# Patient Record
Sex: Male | Born: 1970 | Hispanic: Yes | Smoking: Never smoker
Health system: Southern US, Community
[De-identification: ages and names within clinical notes are randomized; demographics above are authoritative.]

## PROBLEM LIST (undated history)

## (undated) DIAGNOSIS — E7800 Pure hypercholesterolemia, unspecified: Secondary | ICD-10-CM

---

## 2006-01-28 ENCOUNTER — Emergency Department (HOSPITAL_COMMUNITY): Admission: EM | Admit: 2006-01-28 | Discharge: 2006-01-28 | Payer: Self-pay | Admitting: Emergency Medicine

## 2011-11-20 ENCOUNTER — Encounter (HOSPITAL_COMMUNITY): Payer: Self-pay | Admitting: *Deleted

## 2011-11-20 ENCOUNTER — Emergency Department (HOSPITAL_COMMUNITY)
Admission: EM | Admit: 2011-11-20 | Discharge: 2011-11-20 | Disposition: A | Discharge status: Home / Self Care | Payer: Self-pay | Attending: Emergency Medicine | Admitting: Emergency Medicine

## 2011-11-20 DIAGNOSIS — N342 Other urethritis: Secondary | ICD-10-CM | POA: Insufficient documentation

## 2011-11-20 LAB — URINALYSIS, ROUTINE W REFLEX MICROSCOPIC
Hgb urine dipstick: NEGATIVE
Leukocytes, UA: NEGATIVE
Nitrite: NEGATIVE
Specific Gravity, Urine: <1.005 — ABNORMAL LOW (ref 1.005–1.030)
Urobilinogen, UA: 0.2 mg/dL (ref 0.0–1.0)

## 2011-11-20 MED ORDER — AZITHROMYCIN 250 MG PO TABS
1000.0000 mg | ORAL_TABLET | Freq: Once | ORAL | Status: AC
  Administered 2011-11-20: 1000 mg via ORAL
  Filled 2011-11-20: qty 4

## 2011-11-20 MED ORDER — HYDROCODONE-ACETAMINOPHEN 5-325 MG PO TABS
1.0000 | ORAL_TABLET | ORAL | Status: AC | PRN
Start: 2011-11-20 — End: 2011-11-30

## 2011-11-20 MED ORDER — LIDOCAINE HCL (PF) 1 % IJ SOLN
INTRAMUSCULAR | Status: AC
  Administered 2011-11-20: 5 mL
  Filled 2011-11-20: qty 5

## 2011-11-20 MED ORDER — CEFTRIAXONE SODIUM 250 MG IJ SOLR
250.0000 mg | Freq: Once | INTRAMUSCULAR | Status: AC
  Administered 2011-11-20: 250 mg via INTRAMUSCULAR
  Filled 2011-11-20: qty 250

## 2011-11-20 MED ORDER — PHENAZOPYRIDINE HCL 200 MG PO TABS: 200.0000 mg | ORAL_TABLET | Freq: Three times a day (TID) | ORAL | Status: AC

## 2011-11-20 NOTE — ED Notes (Signed)
Pt presents to er with c/o burning with urination and left groin pain that started two days ago pt denies any other symptoms.

## 2011-11-20 NOTE — Discharge Instructions (Signed)
Urethritis, Adult  Urethritis is an inflammation (soreness) of the urethra (the tube exiting from the bladder). It is often caused by germs that may be spread through sexual contact.  TREATMENT   Urethritis will usually respond to antibiotics. These are medications that kill germs. Take all the medicine given to you. You may feel better in a couple days, but TAKE ALL MEDICINE or the infection may not be completely cured and may become more difficult to treat. Response can generally be expected in 7 to 10 days. You may require additional treatment after more testing.  HOME CARE INSTRUCTIONS   Not have sex until the test results are known and treatment is completed.   Know that you may be asked to notify your sex partner when your final test results are back.   Finish all medications as prescribed.   Prevent sexually transmitted infections including AIDS. Practice safe sex. Use condoms.  SEEK MEDICAL CARE IF:    Your symptoms are not improved in 2 to 3 days.   Your symptoms are getting worse.   Your develop abdominal pain.   You develop joint pain.  SEEK IMMEDIATE MEDICAL CARE IF:    You have a fever.   You develop severe pain in the belly, back or side.   You develop repeated vomiting.  TEST RESULTS  Not all test results are available during your visit. If your test results are not back during the visit, make an appointment with your caregiver to find out the results. Do not assume everything is normal if you have not heard from your caregiver or the medical facility. It is important for you to follow-up on all of your test results.  Document Released: 02/07/2001 Document Revised: 08/03/2011 Document Reviewed: 08/30/2009  Exit[employer] Patient Information 2012 [employer].

## 2011-11-20 NOTE — ED Provider Notes (Signed)
History     CSN: [id number]  Arrival date & time 11/20/11  6063   None     Chief Complaint  Patient presents with  . Urinary Tract Infection  . Groin Pain    (Consider location/radiation/quality/duration/timing/severity/associated sxs/prior treatment) Patient is a 41 y.o. male presenting with urinary tract infection and groin pain. The history is provided by the patient and a friend.  Urinary Tract Infection This is a new problem. The current episode started in the past 7 days. The problem has been unchanged. Associated symptoms include urinary symptoms. Pertinent negatives include no abdominal pain, anorexia, arthralgias, chest pain, chills, coughing, fever, nausea, neck pain or rash. Exacerbated by: urination. He has tried nothing for the symptoms. The treatment provided no relief.  Groin Pain Associated symptoms include urinary symptoms. Pertinent negatives include no abdominal pain, anorexia, arthralgias, chest pain, chills, coughing, fever, nausea, neck pain or rash.    History reviewed. No pertinent past medical history.  History reviewed. No pertinent past surgical history.  History reviewed. No pertinent family history.  History  Substance Use Topics  . Smoking status: Never Smoker   . Smokeless tobacco: Not on file  . Alcohol Use: No      Review of Systems  Constitutional: Negative for fever, chills and activity change.       All ROS Neg except as noted in HPI  HENT: Negative for nosebleeds and neck pain.   Eyes: Negative for photophobia and discharge.  Respiratory: Negative for cough, shortness of breath and wheezing.   Cardiovascular: Negative for chest pain and palpitations.  Gastrointestinal: Negative for nausea, abdominal pain, blood in stool and anorexia.  Genitourinary: Positive for penile pain. Negative for dysuria, frequency and hematuria.  Musculoskeletal: Negative for back pain and arthralgias.  Skin: Negative.  Negative for rash.  Neurological:  Negative for dizziness, seizures and speech difficulty.  Psychiatric/Behavioral: Negative for hallucinations and confusion.    Allergies  Review of patient's allergies indicates no known allergies.  Home Medications  No current outpatient prescriptions on file.  BP 128/76  Pulse 68  Temp 97.3 F (36.3 C)  Resp 18  Ht 5\' 4"  (1.626 m)  Wt 147 lb (66.679 kg)  BMI 25.23 kg/m2  SpO2 100%  Physical Exam  Nursing note and vitals reviewed. Constitutional: He is oriented to person, place, and time. He appears well-developed and well-nourished.  Non-toxic appearance.  HENT:  Head: Normocephalic.  Right Ear: Tympanic membrane and external ear normal.  Left Ear: Tympanic membrane and external ear normal.  Eyes: EOM and lids are normal. Pupils are equal, round, and reactive to light.  Neck: Normal range of motion. Neck supple. Carotid bruit is not present.  Cardiovascular: Normal rate, regular rhythm, normal heart sounds, intact distal pulses and normal pulses.   Pulmonary/Chest: Breath sounds normal. No respiratory distress.  Abdominal: Soft. Bowel sounds are normal. There is no tenderness. There is no guarding.  Genitourinary:       No drainage or discharge. No rash of the penis. No inguinal hernia. No testicular tenderness. Mild increase redness of the urethra  Musculoskeletal: Normal range of motion.  Lymphadenopathy:       Head (right side): No submandibular adenopathy present.       Head (left side): No submandibular adenopathy present.    He has no cervical adenopathy.  Neurological: He is alert and oriented to person, place, and time. He has normal strength. No cranial nerve deficit or sensory deficit.  Skin: Skin is warm and  dry.  Psychiatric: He has a normal mood and affect. His speech is normal.    ED Course  Procedures (including critical care time)  Labs Reviewed  URINALYSIS, ROUTINE W REFLEX MICROSCOPIC - Abnormal; Notable for the following:    Color, Urine STRAW (*)     Specific Gravity, Urine <1.005 (*)    All other components within normal limits  GC/CHLAMYDIA PROBE AMP, GENITAL   No results found.   No diagnosis found.    MDM  I have reviewed nursing notes, vital signs, and all appropriate lab and imaging results for this patient. Pt treated with Rocephin and zithromax. [facility]       Kathie Dike, [place] 11/26/11 2026

## 2011-11-21 LAB — GC/CHLAMYDIA PROBE AMP, GENITAL
Chlamydia, DNA Probe: NEGATIVE
GC Probe Amp, Genital: NEGATIVE

## 2011-11-27 NOTE — ED Provider Notes (Signed)
Medical screening examination/treatment/procedure(s) were performed by non-physician practitioner and as supervising physician I was immediately available for consultation/collaboration.   Laray Anger, DO 11/27/11 [phone]

## 2014-07-16 ENCOUNTER — Emergency Department (HOSPITAL_COMMUNITY): Payer: Self-pay

## 2014-07-16 ENCOUNTER — Emergency Department (HOSPITAL_COMMUNITY)
Admission: EM | Admit: 2014-07-16 | Discharge: 2014-07-16 | Disposition: A | Discharge status: Home / Self Care | Payer: Self-pay | Attending: Emergency Medicine | Admitting: Emergency Medicine

## 2014-07-16 ENCOUNTER — Encounter (HOSPITAL_COMMUNITY): Payer: Self-pay

## 2014-07-16 DIAGNOSIS — R109 Unspecified abdominal pain: Secondary | ICD-10-CM | POA: Insufficient documentation

## 2014-07-16 DIAGNOSIS — Z791 Long term (current) use of non-steroidal anti-inflammatories (NSAID): Secondary | ICD-10-CM | POA: Insufficient documentation

## 2014-07-16 DIAGNOSIS — Z8639 Personal history of other endocrine, nutritional and metabolic disease: Secondary | ICD-10-CM | POA: Insufficient documentation

## 2014-07-16 HISTORY — DX: Pure hypercholesterolemia, unspecified: E78.00

## 2014-07-16 LAB — BASIC METABOLIC PANEL
Anion gap: 11 (ref 5–15)
BUN: 15 mg/dL (ref 6–23)
CALCIUM: 9 mg/dL (ref 8.4–10.5)
CO2: 27 meq/L (ref 19–32)
CREATININE: 0.77 mg/dL (ref 0.50–1.35)
Chloride: 101 mEq/L (ref 96–112)
GFR calc Af Amer: >90 mL/min (ref >90)
GFR calc non Af Amer: >90 mL/min (ref >90)
GLUCOSE: 150 mg/dL — AB (ref 70–99)
Potassium: 3.6 mEq/L — ABNORMAL LOW (ref 3.7–5.3)
Sodium: 139 mEq/L (ref 137–147)

## 2014-07-16 LAB — URINALYSIS, ROUTINE W REFLEX MICROSCOPIC
BILIRUBIN URINE: NEGATIVE
GLUCOSE, UA: NEGATIVE mg/dL
HGB URINE DIPSTICK: NEGATIVE
KETONES UR: NEGATIVE mg/dL
LEUKOCYTES UA: NEGATIVE
Nitrite: NEGATIVE
PH: 6 (ref 5.0–8.0)
Protein, ur: NEGATIVE mg/dL
Specific Gravity, Urine: 1.025 (ref 1.005–1.030)
Urobilinogen, UA: 0.2 mg/dL (ref 0.0–1.0)

## 2014-07-16 LAB — CBC WITH DIFFERENTIAL/PLATELET
Basophils Absolute: 0 10*3/uL (ref 0.0–0.1)
Basophils Relative: 0 % (ref 0–1)
EOS PCT: 3 % (ref 0–5)
Eosinophils Absolute: 0.2 10*3/uL (ref 0.0–0.7)
HEMATOCRIT: 43 % (ref 39.0–52.0)
Hemoglobin: 14.6 g/dL (ref 13.0–17.0)
LYMPHS ABS: 2.1 10*3/uL (ref 0.7–4.0)
LYMPHS PCT: 43 % (ref 12–46)
MCH: 30 pg (ref 26.0–34.0)
MCHC: 34 g/dL (ref 30.0–36.0)
MCV: 88.3 fL (ref 78.0–100.0)
MONO ABS: 0.5 10*3/uL (ref 0.1–1.0)
MONOS PCT: 10 % (ref 3–12)
Neutro Abs: 2.1 10*3/uL (ref 1.7–7.7)
Neutrophils Relative %: 44 % (ref 43–77)
Platelets: 220 10*3/uL (ref 150–400)
RBC: 4.87 MIL/uL (ref 4.22–5.81)
RDW: 13.3 % (ref 11.5–15.5)
WBC: 4.9 10*3/uL (ref 4.0–10.5)

## 2014-07-16 MED ORDER — HYDROCODONE-ACETAMINOPHEN 5-325 MG PO TABS: 1.0000 | ORAL_TABLET | Freq: Four times a day (QID) | ORAL | Status: DC | PRN

## 2014-07-16 MED ORDER — NAPROXEN 500 MG PO TABS: 500.0000 mg | ORAL_TABLET | Freq: Two times a day (BID) | ORAL | Status: DC

## 2014-07-16 MED ORDER — HYDROCODONE-ACETAMINOPHEN 5-325 MG PO TABS
1.0000 | ORAL_TABLET | Freq: Once | ORAL | Status: AC
  Administered 2014-07-16: 1 via ORAL
  Filled 2014-07-16: qty 1

## 2014-07-16 NOTE — ED Notes (Signed)
Left flank pain per pt. X 1 week.

## 2014-07-16 NOTE — ED Notes (Signed)
Pt reports 10/10 left lower rib cage pain. Pt denies change in urinary pattern. Pt reports malodorus urine. Pt denies pain upon urination. Pt reports pain upon sneezing.

## 2014-07-16 NOTE — Discharge Instructions (Signed)
Flank Pain Flank pain is pain in your side. The flank is the area of your side between your upper belly (abdomen) and your back. Pain in this area can be caused by many different things. HOME CARE Home care and treatment will depend on the cause of your pain.  Rest as told by your doctor.  Drink enough fluids to keep your pee (urine) clear or pale yellow.  Only take medicine as told by your doctor.  Tell your doctor about any changes in your pain.  Follow up with your doctor. GET HELP RIGHT AWAY IF:   Your pain does not get better with medicine.   You have new symptoms or your symptoms get worse.  Your pain gets worse.   You have belly (abdominal) pain.   You are short of breath.   You always feel sick to your stomach (nauseous).   You keep throwing up (vomiting).   You have puffiness (swelling) in your belly.   You feel light-headed or you pass out (faint).   You have blood in your pee.  You have a fever or lasting symptoms for more than 2-3 days.  You have a fever and your symptoms suddenly get worse. MAKE SURE YOU:   Understand these instructions.  Will watch your condition.  Will get help right away if you are not doing well or get worse. Document Released: 05/23/2008 Document Revised: 12/29/2013 Document Reviewed: 03/28/2012 [facility] Patient Information 2015 [place]Care, [place]. This information is not intended to replace advice given to you by your health care provider. Make sure you discuss any questions you have with your health care provider.  Workup for the flank pain including a normal urinalysis no signs of infection. CAT scan negative for any problems inside the abdomen. Also no evidence of any kidney stones. Symptoms may be musculoskeletal in nature. Chest x-ray also negative. Take the Naprosyn on a regular basis. Supplement with the hydrocodone as needed for additional pain relief. Work note provided for the next 2 days. Return for any new or  worse symptoms.

## 2014-07-16 NOTE — ED Provider Notes (Signed)
CSN: [id number]     Arrival date & time 07/16/14  1320 History  This chart was scribed for Vanetta MuldersScott Brazos Sandoval, MD by Annye AsaAnna Dorsett, ED Scribe. This patient was seen in room APA14/APA14 and the patient's care was started at 4:46 PM.    Chief Complaint  Patient presents with  . Flank Pain   Patient is a 43 y.o. male presenting with flank pain. The history is provided by the patient. No language interpreter was used.  Flank Pain This is a new problem. The current episode started more than 1 week ago. The problem occurs constantly. The problem has not changed since onset.Pertinent negatives include no chest pain, no abdominal pain, no headaches and no shortness of breath. The symptoms are aggravated by sneezing. Nothing relieves the symptoms. He has tried nothing for the symptoms.     HPI Comments: George Morales is a 43 y.o. male who presents to the Emergency Department complaining of 1 week of constant left flank pain, rated 10/10 at present. Pain is worse with sneezing. Patient denies nausea, vomiting, fevers. He denies prior experience with similar symptoms.   Past Medical History  Diagnosis Date  . Hypercholesteremia    No past surgical history on file. No family history on file. History  Substance Use Topics  . Smoking status: Never Smoker   . Smokeless tobacco: Not on file  . Alcohol Use: No    Review of Systems  Constitutional: Negative for fever and chills.  HENT: Negative for congestion, rhinorrhea and sore throat.   Eyes: Negative for visual disturbance.  Respiratory: Negative for cough and shortness of breath.   Cardiovascular: Negative for chest pain and leg swelling.  Gastrointestinal: Negative for nausea, vomiting, abdominal pain and diarrhea.  Genitourinary: Positive for flank pain. Negative for dysuria, frequency and hematuria.  Musculoskeletal: Negative for back pain.  Skin: Negative for rash.  Neurological: Negative for headaches.  Hematological: Does not bruise/bleed  easily.  Psychiatric/Behavioral: Negative for confusion.      Allergies  Morphine and related  Home Medications   Prior to Admission medications   Medication Sig Start Date End Date Taking? Authorizing Provider  acetaminophen (TYLENOL) 500 MG tablet Take 500 mg by mouth every 6 (six) hours as needed for mild pain or moderate pain.   Yes Historical Provider, MD  Omega-3 Fatty Acids (FISH OIL) 1000 MG CAPS Take 2 capsules by mouth 2 (two) times daily.   Yes Historical Provider, MD  HYDROcodone-acetaminophen (NORCO) 5-325 MG per tablet Take 1-2 tablets by mouth every 6 (six) hours as needed for moderate pain. 07/16/14   Vanetta MuldersScott Delories Mauri, MD  naproxen (NAPROSYN) 500 MG tablet Take 1 tablet (500 mg total) by mouth 2 (two) times daily. 07/16/14   Vanetta MuldersScott Elizah Mierzwa, MD   BP 114/71 mmHg  Pulse 68  Temp(Src) 98.2 F (36.8 C) (Oral)  Resp 17  Ht 5\' 4"  (1.626 m)  Wt 147 lb (66.679 kg)  BMI 25.22 kg/m2  SpO2 100% Physical Exam  Constitutional: He is oriented to person, place, and time. He appears well-developed and well-nourished.  HENT:  Head: Normocephalic and atraumatic.  Neck: No tracheal deviation present.  Cardiovascular: Normal rate, regular rhythm and normal heart sounds.  Exam reveals no gallop and no friction rub.   No murmur heard. Pulmonary/Chest: Effort normal and breath sounds normal. No respiratory distress. He has no wheezes. He has no rales.  Abdominal: Soft. Bowel sounds are normal. He exhibits no distension. There is tenderness (Lower part of the chest; rib  margin). There is no rebound and no guarding.  Musculoskeletal: He exhibits no edema.  Neurological: He is alert and oriented to person, place, and time.  Skin: Skin is warm and dry.  Psychiatric: He has a normal mood and affect. His behavior is normal.  Nursing note and vitals reviewed.   ED Course  Procedures   DIAGNOSTIC STUDIES: Oxygen Saturation is 99% on RA, normal by my interpretation.    COORDINATION OF  CARE: 5:57 PM Discussed treatment plan with pt at bedside and pt agreed to plan.   Labs Review Labs Reviewed  BASIC METABOLIC PANEL - Abnormal; Notable for the following:    Potassium 3.6 (*)    Glucose, Bld 150 (*)    All other components within normal limits  CBC WITH DIFFERENTIAL  URINALYSIS, ROUTINE W REFLEX MICROSCOPIC   Results for orders placed or performed during the hospital encounter of 07/16/14  CBC with Differential  Result Value Ref Range   WBC 4.9 4.0 - 10.5 K/uL   RBC 4.87 4.22 - 5.81 MIL/uL   Hemoglobin 14.6 13.0 - 17.0 g/dL   HCT 40.943.0 81.139.0 - 91.452.0 %   MCV 88.3 78.0 - 100.0 fL   MCH 30.0 26.0 - 34.0 pg   MCHC 34.0 30.0 - 36.0 g/dL   RDW 78.213.3 95.611.5 - 21.315.5 %   Platelets 220 150 - 400 K/uL   Neutrophils Relative % 44 43 - 77 %   Neutro Abs 2.1 1.7 - 7.7 K/uL   Lymphocytes Relative 43 12 - 46 %   Lymphs Abs 2.1 0.7 - 4.0 K/uL   Monocytes Relative 10 3 - 12 %   Monocytes Absolute 0.5 0.1 - 1.0 K/uL   Eosinophils Relative 3 0 - 5 %   Eosinophils Absolute 0.2 0.0 - 0.7 K/uL   Basophils Relative 0 0 - 1 %   Basophils Absolute 0.0 0.0 - 0.1 K/uL  Basic metabolic panel  Result Value Ref Range   Sodium 139 137 - 147 mEq/L   Potassium 3.6 (L) 3.7 - 5.3 mEq/L   Chloride 101 96 - 112 mEq/L   CO2 27 19 - 32 mEq/L   Glucose, Bld 150 (H) 70 - 99 mg/dL   BUN 15 6 - 23 mg/dL   Creatinine, Ser 0.860.77 0.50 - 1.35 mg/dL   Calcium 9.0 8.4 - 57.810.5 mg/dL   GFR calc non Af Amer >90 >90 mL/min   GFR calc Af Amer >90 >90 mL/min   Anion gap 11 5 - 15  Urinalysis, Routine w reflex microscopic  Result Value Ref Range   Color, Urine YELLOW YELLOW   APPearance CLEAR CLEAR   Specific Gravity, Urine 1.025 1.005 - 1.030   pH 6.0 5.0 - 8.0   Glucose, UA NEGATIVE NEGATIVE mg/dL   Hgb urine dipstick NEGATIVE NEGATIVE   Bilirubin Urine NEGATIVE NEGATIVE   Ketones, ur NEGATIVE NEGATIVE mg/dL   Protein, ur NEGATIVE NEGATIVE mg/dL   Urobilinogen, UA 0.2 0.0 - 1.0 mg/dL   Nitrite NEGATIVE  NEGATIVE   Leukocytes, UA NEGATIVE NEGATIVE     Imaging Review Dg Chest 2 View  07/16/2014   CLINICAL DATA:  Left-sided flank pain  EXAM: CHEST  2 VIEW  COMPARISON:  05/11/2012  FINDINGS: The cardiac shadow is stable. The lungs are clear bilaterally. No acute bony abnormality is seen.  IMPRESSION: No active cardiopulmonary disease.   Electronically Signed   By: Alcide CleverMark  Lukens M.D.   On: 07/16/2014 17:48   Ct Renal Stone Study  07/16/2014   CLINICAL DATA:  Acute left flank pain.  EXAM: CT ABDOMEN AND PELVIS WITHOUT CONTRAST  TECHNIQUE: Multidetector CT imaging of the abdomen and pelvis was performed following the standard protocol without IV contrast.  COMPARISON:  05/11/2012  FINDINGS: Lower chest: The lung bases are clear of acute process. No pleural effusion or pulmonary lesions. The heart is normal in size. No pericardial effusion. The distal esophagus and aorta are unremarkable.  Hepatobiliary: No focal liver lesions or intrahepatic biliary dilatation. The gallbladder is normal. No common bile duct dilatation.  Pancreas: Normal  Spleen: Normal  Adrenals/Urinary Tract: The adrenal glands are normal. Both kidneys are normal. No renal or obstructing ureteral calculi. No hydronephrosis. The bladder is mildly distended. No bladder calculi.  Stomach/Bowel: The stomach, duodenum, small bowel and colon are grossly normal without oral contrast. No inflammatory changes, mass lesions or obstructive findings. The terminal ileum is normal. The appendix is normal.  Vascular/Lymphatic: No mesenteric or retroperitoneal mass or adenopathy. The aorta is normal in caliber. No atherosclerotic calcifications.  Pelvis: The bladder is mildly distended. The prostate gland and seminal vesicles are unremarkable. No pelvic mass, adenopathy or free pelvic fluid collections. No inguinal mass or adenopathy.  Musculoskeletal: No significant bony findings.  IMPRESSION: No acute abdominal/ pelvic findings, mass lesions or adenopathy.   No renal, ureteral or bladder calculi.   Electronically Signed   By: Loralie Champagne M.D.   On: 07/16/2014 17:47     EKG Interpretation None      MDM   Final diagnoses:  Flank pain     Workup for the flank pain without any significant findings. Baby musculoskeletal in nature. Some of the discomfort was at the rib margin on the left flank. No history of injury. Will treat with anti-inflammatories and supplemental pain medicine. When patient return for any new or worse symptoms. No evidence urinary tract infection. Chest x-ray negative for pneumonia pneumothorax or any evidence of pleural effusion. CT scan of the abdomen negative for any ureteral stones or other intra-abdominal abnormalities.  I personally performed the services described in this documentation, which was scribed in my presence. The recorded information has been reviewed and is accurate.       Vanetta Mulders, MD 07/16/14 1758

## 2015-02-05 ENCOUNTER — Emergency Department (HOSPITAL_COMMUNITY)
Admission: EM | Admit: 2015-02-05 | Discharge: 2015-02-06 | Disposition: A | Discharge status: Home / Self Care | Payer: Self-pay | Attending: Emergency Medicine | Admitting: Emergency Medicine

## 2015-02-05 ENCOUNTER — Encounter (HOSPITAL_COMMUNITY): Payer: Self-pay

## 2015-02-05 DIAGNOSIS — E780 Pure hypercholesterolemia: Secondary | ICD-10-CM | POA: Insufficient documentation

## 2015-02-05 DIAGNOSIS — Z791 Long term (current) use of non-steroidal anti-inflammatories (NSAID): Secondary | ICD-10-CM | POA: Insufficient documentation

## 2015-02-05 DIAGNOSIS — F10920 Alcohol use, unspecified with intoxication, uncomplicated: Secondary | ICD-10-CM

## 2015-02-05 DIAGNOSIS — Z79899 Other long term (current) drug therapy: Secondary | ICD-10-CM | POA: Insufficient documentation

## 2015-02-05 DIAGNOSIS — F10120 Alcohol abuse with intoxication, uncomplicated: Secondary | ICD-10-CM | POA: Insufficient documentation

## 2015-02-05 NOTE — ED Notes (Signed)
Pt apparently drinking heavily tonight and has had several panic attacks, is hyperventilating and will not stop when asked. Pt c/o tingling in his hands and feet

## 2015-02-06 LAB — CBC WITH DIFFERENTIAL/PLATELET
BASOS ABS: 0 10*3/uL (ref 0.0–0.1)
BASOS PCT: 0 % (ref 0–1)
Eosinophils Absolute: 0 10*3/uL (ref 0.0–0.7)
Eosinophils Relative: 0 % (ref 0–5)
HEMATOCRIT: 42.4 % (ref 39.0–52.0)
HEMOGLOBIN: 14.2 g/dL (ref 13.0–17.0)
LYMPHS ABS: 1.1 10*3/uL (ref 0.7–4.0)
Lymphocytes Relative: 10 % — ABNORMAL LOW (ref 12–46)
MCH: 29.4 pg (ref 26.0–34.0)
MCHC: 33.5 g/dL (ref 30.0–36.0)
MCV: 87.8 fL (ref 78.0–100.0)
MONO ABS: 0.6 10*3/uL (ref 0.1–1.0)
MONOS PCT: 5 % (ref 3–12)
NEUTROS PCT: 85 % — AB (ref 43–77)
Neutro Abs: 8.8 10*3/uL — ABNORMAL HIGH (ref 1.7–7.7)
PLATELETS: 209 10*3/uL (ref 150–400)
RBC: 4.83 MIL/uL (ref 4.22–5.81)
RDW: 13.1 % (ref 11.5–15.5)
WBC: 10.4 10*3/uL (ref 4.0–10.5)

## 2015-02-06 LAB — COMPREHENSIVE METABOLIC PANEL
ALBUMIN: 4.3 g/dL (ref 3.5–5.0)
ALT: 39 U/L (ref 17–63)
ANION GAP: 13 (ref 5–15)
AST: 25 U/L (ref 15–41)
Alkaline Phosphatase: 53 U/L (ref 38–126)
BUN: 17 mg/dL (ref 6–20)
CALCIUM: 8.8 mg/dL — AB (ref 8.9–10.3)
CO2: 22 mmol/L (ref 22–32)
CREATININE: 0.62 mg/dL (ref 0.61–1.24)
Chloride: 105 mmol/L (ref 101–111)
GFR calc Af Amer: >60 mL/min (ref >60)
GFR calc non Af Amer: >60 mL/min (ref >60)
GLUCOSE: 118 mg/dL — AB (ref 65–99)
POTASSIUM: 3.2 mmol/L — AB (ref 3.5–5.1)
SODIUM: 140 mmol/L (ref 135–145)
Total Bilirubin: 0.8 mg/dL (ref 0.3–1.2)
Total Protein: 7.1 g/dL (ref 6.5–8.1)

## 2015-02-06 LAB — ETHANOL: ALCOHOL ETHYL (B): 111 mg/dL — AB (ref <5)

## 2015-02-06 MED ORDER — SODIUM CHLORIDE 0.9 % IV BOLUS (SEPSIS)
1000.0000 mL | Freq: Once | INTRAVENOUS | Status: AC
  Administered 2015-02-06: 1000 mL via INTRAVENOUS

## 2015-02-06 NOTE — ED Provider Notes (Signed)
CSN: [id number]     Arrival date & time 02/05/15  2339 History  This chart was scribed for Geoffery Lyons, MD by Karle Plumber, ED Scribe. This patient was seen in room APA06/APA06 and the patient's care was started at 12:26 AM.  Chief Complaint  Patient presents with  . Alcohol Intoxication   Patient is a 44 y.o. male presenting with intoxication. The history is provided by the patient and medical records. No language interpreter was used.  Alcohol Intoxication   LEVEL 5 CAVEAT- Full history could not be obtained due to intoxication.  HPI Comments:  George Morales is a 44 y.o. male who presents to the Emergency Department complaining of alcohol intoxication. His girlfriend states after work the pt started drinking beer constantly for about 4.5 hours. She reports associated emesis. She reports he was breathing heavily and having cramps in his fingers as well. He has not done anything to treat his symptoms. Denies modifying factors. She denies any illicit drug use. PMHx of hypercholesteremia.  Past Medical History  Diagnosis Date  . Hypercholesteremia    History reviewed. No pertinent past surgical history. No family history on file. History  Substance Use Topics  . Smoking status: Never Smoker   . Smokeless tobacco: Not on file  . Alcohol Use: Yes    LEVEL 5 CAVEAT- Full history could not be obtained due to intoxication.  Review of Systems  Unable to perform ROS   Allergies  Morphine and related  Home Medications   Prior to Admission medications   Medication Sig Start Date End Date Taking? Authorizing Provider  acetaminophen (TYLENOL) 500 MG tablet Take 500 mg by mouth every 6 (six) hours as needed for mild pain or moderate pain.    Historical Provider, MD  HYDROcodone-acetaminophen (NORCO) 5-325 MG per tablet Take 1-2 tablets by mouth every 6 (six) hours as needed for moderate pain. 07/16/14   Vanetta Mulders, MD  naproxen (NAPROSYN) 500 MG tablet Take 1 tablet (500 mg  total) by mouth 2 (two) times daily. 07/16/14   Vanetta Mulders, MD  Omega-3 Fatty Acids (FISH OIL) 1000 MG CAPS Take 2 capsules by mouth 2 (two) times daily.    Historical Provider, MD  simvastatin (ZOCOR) 20 MG tablet Take 20 mg by mouth daily.    Historical Provider, MD   Triage Vitals: BP 115/82 mmHg  Pulse 100  Temp(Src) 97.9 F (36.6 C) (Oral)  Resp 26  SpO2 93% Physical Exam  Constitutional: He is oriented to person, place, and time. He appears well-developed and well-nourished.  Pt is somnolent but arousable and appropriate. Intoxicated.  HENT:  Head: Normocephalic and atraumatic.  Eyes: EOM are normal. Pupils are equal, round, and reactive to light.  Neck: Normal range of motion.  Cardiovascular: Normal rate, regular rhythm and normal heart sounds.  Exam reveals no gallop and no friction rub.   No murmur heard. Pulmonary/Chest: Effort normal and breath sounds normal. No respiratory distress. He has no wheezes. He has no rales.  Musculoskeletal: Normal range of motion.  Neurological: He is alert and oriented to person, place, and time.  Neuro exam difficult due to intoxication. Moves all four extremities and responds to commands appropriately.  Skin: Skin is warm and dry.  Psychiatric: He has a normal mood and affect. His behavior is normal.  Nursing note and vitals reviewed.   ED Course  Procedures (including critical care time) DIAGNOSTIC STUDIES: Oxygen Saturation is 93% on RA, low by my interpretation.   COORDINATION OF CARE:  12:30 AM- Will order fluids and labs. Pt verbalizes understanding and agrees to plan.  Medications - No data to display  Labs Review Labs Reviewed - No data to display  Imaging Review No results found.   EKG Interpretation None      MDM   Final diagnoses:  None    Patient brought for evaluation by EMS of suspected alcohol intoxication. The patient got off work this afternoon and went drinking with his coworkers. This patient  does not drink very often and was apparently coaxed into chugging beers with his friends. He became intoxicated and incoherent and was brought here to be evaluated. His blood alcohol is 111 and he otherwise appears well. He is somnolent, but is easily arousable and follows commands. He was observed for several hours in the ER and is now ambulatory and appears better. He will be discharged with instructions to avoid drinking in excess and return to the ER as needed for any problems.  I personally performed the services described in this documentation, which was scribed in my presence. The recorded information has been reviewed and is accurate.    Geoffery Lyons, MD 02/06/15 [id number]

## 2015-02-06 NOTE — ED Notes (Signed)
Went in to do rounding and patient was asleep. Spoke with family and got them coffee.

## 2015-02-06 NOTE — Discharge Instructions (Signed)
Alcohol Intoxication  Alcohol intoxication occurs when the amount of alcohol that a person has consumed impairs his or her ability to mentally and physically function. Alcohol directly impairs the normal chemical activity of the brain. Drinking large amounts of alcohol can lead to changes in mental function and behavior, and it can cause many physical effects that can be harmful.   Alcohol intoxication can range in severity from mild to very severe. Various factors can affect the level of intoxication that occurs, such as the person's age, gender, weight, frequency of alcohol consumption, and the presence of other medical conditions (such as diabetes, seizures, or heart conditions). Dangerous levels of alcohol intoxication may occur when people drink large amounts of alcohol in a short period (binge drinking). Alcohol can also be especially dangerous when combined with certain prescription medicines or "recreational" drugs.  SIGNS AND SYMPTOMS  Some common signs and symptoms of mild alcohol intoxication include:  · Loss of coordination.  · Changes in mood and behavior.  · Impaired judgment.  · Slurred speech.  As alcohol intoxication progresses to more severe levels, other signs and symptoms will appear. These may include:  · Vomiting.  · Confusion and impaired memory.  · Slowed breathing.  · Seizures.  · Loss of consciousness.  DIAGNOSIS   Your health care provider will take a medical history and perform a physical exam. You will be asked about the amount and type of alcohol you have consumed. Blood tests will be done to measure the concentration of alcohol in your blood. In many places, your blood alcohol level must be lower than 80 mg/dL (0.08%) to legally drive. However, many dangerous effects of alcohol can occur at much lower levels.   TREATMENT   People with alcohol intoxication often do not require treatment. Most of the effects of alcohol intoxication are temporary, and they go away as the alcohol naturally  leaves the body. Your health care provider will monitor your condition until you are stable enough to go home. Fluids are sometimes given through an IV access tube to help prevent dehydration.   HOME CARE INSTRUCTIONS  · Do not drive after drinking alcohol.  · Stay hydrated. Drink enough water and fluids to keep your urine clear or pale yellow. Avoid caffeine.    · Only take over-the-counter or prescription medicines as directed by your health care provider.    SEEK MEDICAL CARE IF:   · You have persistent vomiting.    · You do not feel better after a few days.  · You have frequent alcohol intoxication. Your health care provider can help determine if you should see a substance use treatment counselor.  SEEK IMMEDIATE MEDICAL CARE IF:   · You become shaky or tremble when you try to stop drinking.    · You shake uncontrollably (seizure).    · You throw up (vomit) blood. This may be bright red or may look like black coffee grounds.    · You have blood in your stool. This may be bright red or may appear as a black, tarry, bad smelling stool.    · You become lightheaded or faint.    MAKE SURE YOU:   · Understand these instructions.  · Will watch your condition.  · Will get help right away if you are not doing well or get worse.  Document Released: 05/24/2005 Document Revised: 04/16/2013 Document Reviewed: 01/17/2013  Exit[employer]® Patient Information ©2015 [employer]. This information is not intended to replace advice given to you by your health care provider. Make sure   you discuss any questions you have with your health care provider.

## 2015-08-10 ENCOUNTER — Other Ambulatory Visit: Payer: Self-pay | Admitting: Physician Assistant

## 2015-10-26 ENCOUNTER — Other Ambulatory Visit: Payer: Self-pay | Admitting: Physician Assistant

## 2015-10-26 LAB — LIPID PANEL
CHOL/HDL RATIO: 4.1 ratio (ref <=5.0)
CHOLESTEROL: 171 mg/dL (ref 125–200)
HDL: 42 mg/dL (ref >=40)
LDL Cholesterol: 95 mg/dL (ref <130)
Triglycerides: 168 mg/dL — ABNORMAL HIGH (ref <150)
VLDL: 34 mg/dL — AB (ref <30)

## 2015-10-26 LAB — COMPREHENSIVE METABOLIC PANEL
ALT: 57 U/L — AB (ref 9–46)
AST: 25 U/L (ref 10–40)
Albumin: 4.4 g/dL (ref 3.6–5.1)
Alkaline Phosphatase: 61 U/L (ref 40–115)
BUN: 19 mg/dL (ref 7–25)
CHLORIDE: 105 mmol/L (ref 98–110)
CO2: 26 mmol/L (ref 20–31)
CREATININE: 0.86 mg/dL (ref 0.60–1.35)
Calcium: 9.3 mg/dL (ref 8.6–10.3)
GLUCOSE: 102 mg/dL — AB (ref 65–99)
POTASSIUM: 4.9 mmol/L (ref 3.5–5.3)
SODIUM: 138 mmol/L (ref 135–146)
TOTAL PROTEIN: 7 g/dL (ref 6.1–8.1)
Total Bilirubin: 0.5 mg/dL (ref 0.2–1.2)

## 2015-10-27 ENCOUNTER — Ambulatory Visit: Payer: Self-pay | Admitting: Physician Assistant

## 2015-11-01 ENCOUNTER — Ambulatory Visit: Payer: Self-pay | Admitting: Physician Assistant

## 2015-11-01 ENCOUNTER — Encounter: Payer: Self-pay | Admitting: Physician Assistant

## 2015-11-01 VITALS — BP 120/84 | HR 57 | Temp 99.3°F | Ht 63.5 in | Wt 147.5 lb

## 2015-11-01 DIAGNOSIS — E785 Hyperlipidemia, unspecified: Secondary | ICD-10-CM

## 2015-11-01 NOTE — Progress Notes (Signed)
BP 120/84 mmHg  Pulse 57  Temp(Src) 99.3 F (37.4 C)  Ht 5' 3.5" (1.613 m)  Wt 147 lb 8 oz (66.906 kg)  BMI 25.72 kg/m2  SpO2 98%   Subjective:    Patient ID: George Morales, male    DOB: September 04, 1970, 45 y.o.   MRN: [id number]  HPI: George Morales is a 45 y.o. male presenting on 11/01/2015 for Hyperlipidemia   HPI   Pt feeling well. No complaints  Relevant past medical, surgical, family and social history reviewed and updated as indicated. Interim medical history since our last visit reviewed. Allergies and medications reviewed and updated.  Current outpatient prescriptions:  .  Omega-3 Fatty Acids (FISH OIL) 1000 MG CAPS, Take 2 capsules by mouth at bedtime. , Disp: , Rfl:  .  simvastatin (ZOCOR) 20 MG tablet, Take 1 tablet (20 mg total) by mouth at bedtime. Tome una tableta por boca al dormir, Disp: 30 tablet, Rfl: 3   Review of Systems  Constitutional: Negative for fever, chills, diaphoresis, appetite change, fatigue and unexpected weight change.  HENT: Negative for congestion, dental problem, drooling, ear pain, facial swelling, hearing loss, mouth sores, sneezing, sore throat, trouble swallowing and voice change.   Eyes: Negative for pain, discharge, redness, itching and visual disturbance.  Respiratory: Negative for cough, choking, shortness of breath and wheezing.   Cardiovascular: Negative for chest pain, palpitations and leg swelling.  Gastrointestinal: Negative for vomiting, abdominal pain, diarrhea, constipation and blood in stool.  Endocrine: Negative for cold intolerance, heat intolerance and polydipsia.  Genitourinary: Negative for dysuria, hematuria and decreased urine volume.  Musculoskeletal: Negative for back pain, arthralgias and gait problem.  Skin: Negative for rash.  Allergic/Immunologic: Negative for environmental allergies.  Neurological: Negative for seizures, syncope, light-headedness and headaches.  Hematological: Negative for adenopathy.   Psychiatric/Behavioral: Negative for suicidal ideas, dysphoric mood and agitation. The patient is not nervous/anxious.     Per HPI unless specifically indicated above     Objective:    BP 120/84 mmHg  Pulse 57  Temp(Src) 99.3 F (37.4 C)  Ht 5' 3.5" (1.613 m)  Wt 147 lb 8 oz (66.906 kg)  BMI 25.72 kg/m2  SpO2 98%  Wt Readings from Last 3 Encounters:  11/01/15 147 lb 8 oz (66.906 kg)  07/16/14 147 lb (66.679 kg)  11/20/11 147 lb (66.679 kg)    Physical Exam  Constitutional: He is oriented to person, place, and time. He appears well-developed and well-nourished.  HENT:  Head: Normocephalic and atraumatic.  Neck: Neck supple.  Cardiovascular: Normal rate and regular rhythm.   Pulmonary/Chest: Effort normal and breath sounds normal. He has no wheezes.  Abdominal: Soft. Bowel sounds are normal. There is no hepatosplenomegaly. There is no tenderness.  Musculoskeletal: He exhibits no edema.  Lymphadenopathy:    He has no cervical adenopathy.  Neurological: He is alert and oriented to person, place, and time.  Skin: Skin is warm and dry.  Psychiatric: He has a normal mood and affect. His behavior is normal.  Vitals reviewed.   Results for orders placed or performed in visit on 10/26/15  Comprehensive metabolic panel  Result Value Ref Range   Sodium 138 135 - 146 mmol/L   Potassium 4.9 3.5 - 5.3 mmol/L   Chloride 105 98 - 110 mmol/L   CO2 26 20 - 31 mmol/L   Glucose, Bld 102 (H) 65 - 99 mg/dL   BUN 19 7 - 25 mg/dL   Creat 7.820.86 9.560.60 - 2.131.35 mg/dL  Total Bilirubin 0.5 0.2 - 1.2 mg/dL   Alkaline Phosphatase 61 40 - 115 U/L   AST 25 10 - 40 U/L   ALT 57 (H) 9 - 46 U/L   Total Protein 7.0 6.1 - 8.1 g/dL   Albumin 4.4 3.6 - 5.1 g/dL   Calcium 9.3 8.6 - 40.9 mg/dL  Lipid panel  Result Value Ref Range   Cholesterol 171 125 - 200 mg/dL   Triglycerides 811 (H) <150 mg/dL   HDL 42 >=91 mg/dL   Total CHOL/HDL Ratio 4.1 <=5.0 Ratio   VLDL 34 (H) <30 mg/dL   LDL Cholesterol  95 <478 mg/dL      Assessment & Plan:   Encounter Diagnosis  Name Primary?  . Hyperlipidemia Yes    -reviewed labs with pt -continue current meds -watch lowfat diet and exercise regularly -f/u 6 months.  RTO sooner prn

## 2015-12-09 ENCOUNTER — Other Ambulatory Visit: Payer: Self-pay | Admitting: Physician Assistant

## 2016-04-24 ENCOUNTER — Ambulatory Visit: Payer: Self-pay | Admitting: Physician Assistant

## 2016-04-24 ENCOUNTER — Encounter: Payer: Self-pay | Admitting: Physician Assistant

## 2016-04-24 VITALS — BP 122/74 | HR 63 | Temp 99.1°F | Ht 63.5 in | Wt 144.4 lb

## 2016-04-24 DIAGNOSIS — J029 Acute pharyngitis, unspecified: Secondary | ICD-10-CM

## 2016-04-24 LAB — POCT RAPID STREP A (OFFICE): RAPID STREP A SCREEN: NEGATIVE

## 2016-04-24 NOTE — Progress Notes (Signed)
BP 122/74 (BP Location: Left Arm, Patient Position: Sitting, Cuff Size: Normal)   Pulse 63   Temp 99.1 F (37.3 C)   Ht 5' 3.5" (1.613 m)   Wt 144 lb 6.4 oz (65.5 kg)   SpO2 99%   BMI 25.18 kg/m    Subjective:    Patient ID: George Morales, male    DOB: 18-Mar-1971, 45 y.o.   MRN: [id number]  HPI: George Morales is a 45 y.o. male presenting on 04/24/2016 for Tick Removal (L leg. pt self removed tick) and Sore Throat (L side. pt states it hurts to the touch, when he moves his head, and when he swollows. pt thinks it may be related to tick bite)   HPI  ST started 4 days ago.  Hurts constants.  No cough. No EA. No nasal congestion.  No fever.   Pt pulled tick off his leg 3 wk ago.  Tick was on less than 24 hours.    Relevant past medical, surgical, family and social history reviewed and updated as indicated. Interim medical history since our last visit reviewed. Allergies and medications reviewed and updated.   Current Outpatient Prescriptions:  .  Omega-3 Fatty Acids (FISH OIL) 1000 MG CAPS, Take 3 capsules by mouth at bedtime. , Disp: , Rfl:  .  simvastatin (ZOCOR) 20 MG tablet, Take 1 tablet (20 mg total) by mouth daily with breakfast. Tome una tableta por boca diaria, Disp: 30 tablet, Rfl: 6  Review of Systems  Constitutional: Negative for appetite change, chills, diaphoresis, fatigue, fever and unexpected weight change.  HENT: Positive for sore throat and trouble swallowing. Negative for congestion, dental problem, drooling, ear pain, facial swelling, hearing loss, mouth sores, sneezing and voice change.   Eyes: Negative for pain, discharge, redness, itching and visual disturbance.  Respiratory: Negative for cough, choking, shortness of breath and wheezing.   Cardiovascular: Negative for chest pain, palpitations and leg swelling.  Gastrointestinal: Negative for abdominal pain, blood in stool, constipation, diarrhea and vomiting.  Endocrine: Negative for cold intolerance, heat  intolerance and polydipsia.  Genitourinary: Negative for decreased urine volume, dysuria and hematuria.  Musculoskeletal: Negative for arthralgias, back pain and gait problem.  Skin: Negative for rash.  Allergic/Immunologic: Negative for environmental allergies.  Neurological: Negative for seizures, syncope, light-headedness and headaches.  Hematological: Negative for adenopathy.  Psychiatric/Behavioral: Negative for agitation, dysphoric mood and suicidal ideas. The patient is not nervous/anxious.     Per HPI unless specifically indicated above     Objective:    BP 122/74 (BP Location: Left Arm, Patient Position: Sitting, Cuff Size: Normal)   Pulse 63   Temp 99.1 F (37.3 C)   Ht 5' 3.5" (1.613 m)   Wt 144 lb 6.4 oz (65.5 kg)   SpO2 99%   BMI 25.18 kg/m   Wt Readings from Last 3 Encounters:  04/24/16 144 lb 6.4 oz (65.5 kg)  11/01/15 147 lb 8 oz (66.9 kg)  07/16/14 147 lb (66.7 kg)    Physical Exam  Constitutional: He is oriented to person, place, and time. He appears well-developed and well-nourished.  HENT:  Head: Normocephalic and atraumatic.  Right Ear: Hearing, tympanic membrane, external ear and ear canal normal.  Left Ear: Hearing, tympanic membrane, external ear and ear canal normal.  Nose: Nose normal.  Mouth/Throat: Uvula is midline and oropharynx is clear and moist. No uvula swelling. No oropharyngeal exudate, posterior oropharyngeal edema, posterior oropharyngeal erythema or tonsillar abscesses.  Neck: Neck supple.  Cardiovascular: Normal rate  and regular rhythm.   Pulmonary/Chest: Effort normal and breath sounds normal.  Lymphadenopathy:    He has no cervical adenopathy.  Neurological: He is alert and oriented to person, place, and time.  Skin: Skin is warm and dry.  No rash or lesion at site of tick removal, LLE.   Psychiatric: He has a normal mood and affect. His behavior is normal.  Vitals reviewed.  RST -     Assessment & Plan:   Encounter  Diagnosis  Name Primary?  . Sore throat Yes    -counseled pt on symptomatic relief of ST and gave reading information. -f/u in September as scheduled.  RTO sooner prn

## 2016-04-24 NOTE — Patient Instructions (Signed)

## 2016-04-25 ENCOUNTER — Other Ambulatory Visit: Payer: Self-pay | Admitting: Student

## 2016-04-25 DIAGNOSIS — E785 Hyperlipidemia, unspecified: Secondary | ICD-10-CM

## 2016-04-26 LAB — COMPLETE METABOLIC PANEL WITH GFR
ALT: 32 U/L (ref 9–46)
AST: 19 U/L (ref 10–40)
Albumin: 4.2 g/dL (ref 3.6–5.1)
Alkaline Phosphatase: 63 U/L (ref 40–115)
BILIRUBIN TOTAL: 0.3 mg/dL (ref 0.2–1.2)
BUN: 17 mg/dL (ref 7–25)
CO2: 26 mmol/L (ref 20–31)
CREATININE: 0.75 mg/dL (ref 0.60–1.35)
Calcium: 9 mg/dL (ref 8.6–10.3)
Chloride: 101 mmol/L (ref 98–110)
GFR, Est Non African American: >89 mL/min (ref >=60)
GLUCOSE: 100 mg/dL — AB (ref 65–99)
Potassium: 4.4 mmol/L (ref 3.5–5.3)
Sodium: 138 mmol/L (ref 135–146)
TOTAL PROTEIN: 6.6 g/dL (ref 6.1–8.1)

## 2016-04-26 LAB — LIPID PANEL
Cholesterol: 144 mg/dL (ref 125–200)
HDL: 46 mg/dL (ref >=40)
LDL CALC: 77 mg/dL (ref <130)
Total CHOL/HDL Ratio: 3.1 Ratio (ref <=5.0)
Triglycerides: 107 mg/dL (ref <150)
VLDL: 21 mg/dL (ref <30)

## 2016-05-03 ENCOUNTER — Ambulatory Visit: Payer: Self-pay | Admitting: Physician Assistant

## 2016-05-03 ENCOUNTER — Encounter: Payer: Self-pay | Admitting: Physician Assistant

## 2016-05-03 VITALS — BP 114/68 | HR 56 | Temp 98.1°F | Ht 63.5 in | Wt 146.8 lb

## 2016-05-03 DIAGNOSIS — E785 Hyperlipidemia, unspecified: Secondary | ICD-10-CM

## 2016-05-03 NOTE — Progress Notes (Signed)
BP 114/68 (BP Location: Left Arm, Patient Position: Sitting, Cuff Size: Normal)   Pulse (!) 56   Temp 98.1 F (36.7 C)   Ht 5' 3.5" (1.613 m)   Wt 146 lb 12.8 oz (66.6 kg)   SpO2 99%   BMI 25.60 kg/m    Subjective:    Patient ID: George Morales, male    DOB: 05/12/1971, 45 y.o.   MRN: [id number]  HPI: George Morales is a 45 y.o. male presenting on 05/03/2016 for Hyperlipidemia   HPI   Pt feeling well. No complaints  Relevant past medical, surgical, family and social history reviewed and updated as indicated. Interim medical history since our last visit reviewed. Allergies and medications reviewed and updated.   Current Outpatient Prescriptions:  .  Omega-3 Fatty Acids (FISH OIL) 1000 MG CAPS, Take 3 capsules by mouth at bedtime. , Disp: , Rfl:  .  simvastatin (ZOCOR) 20 MG tablet, Take 1 tablet (20 mg total) by mouth daily with breakfast. Tome una tableta por boca diaria, Disp: 30 tablet, Rfl: 6  Review of Systems  Constitutional: Negative for appetite change, chills, diaphoresis, fatigue, fever and unexpected weight change.  HENT: Negative for congestion, dental problem, drooling, ear pain, facial swelling, hearing loss, mouth sores, sneezing, sore throat, trouble swallowing and voice change.   Eyes: Negative for pain, discharge, redness, itching and visual disturbance.  Respiratory: Negative for cough, choking, shortness of breath and wheezing.   Cardiovascular: Negative for chest pain, palpitations and leg swelling.  Gastrointestinal: Negative for abdominal pain, blood in stool, constipation, diarrhea and vomiting.  Endocrine: Negative for cold intolerance, heat intolerance and polydipsia.  Genitourinary: Negative for decreased urine volume, dysuria and hematuria.  Musculoskeletal: Negative for arthralgias, back pain and gait problem.  Skin: Negative for rash.  Allergic/Immunologic: Negative for environmental allergies.  Neurological: Negative for seizures, syncope,  light-headedness and headaches.  Hematological: Negative for adenopathy.  Psychiatric/Behavioral: Negative for agitation, dysphoric mood and suicidal ideas. The patient is not nervous/anxious.     Per HPI unless specifically indicated above     Objective:    BP 114/68 (BP Location: Left Arm, Patient Position: Sitting, Cuff Size: Normal)   Pulse (!) 56   Temp 98.1 F (36.7 C)   Ht 5' 3.5" (1.613 m)   Wt 146 lb 12.8 oz (66.6 kg)   SpO2 99%   BMI 25.60 kg/m   Wt Readings from Last 3 Encounters:  05/03/16 146 lb 12.8 oz (66.6 kg)  04/24/16 144 lb 6.4 oz (65.5 kg)  11/01/15 147 lb 8 oz (66.9 kg)    Physical Exam  Constitutional: He is oriented to person, place, and time. He appears well-developed and well-nourished.  HENT:  Head: Normocephalic and atraumatic.  Neck: Neck supple.  Cardiovascular: Normal rate and regular rhythm.   Pulmonary/Chest: Effort normal and breath sounds normal. He has no wheezes.  Abdominal: Soft. Bowel sounds are normal. There is no hepatosplenomegaly. There is no tenderness.  Musculoskeletal: He exhibits no edema.  Lymphadenopathy:    He has no cervical adenopathy.  Neurological: He is alert and oriented to person, place, and time.  Skin: Skin is warm and dry.  Psychiatric: He has a normal mood and affect. His behavior is normal.  Vitals reviewed.   Results for orders placed or performed in visit on 04/25/16  COMPLETE METABOLIC PANEL WITH GFR  Result Value Ref Range   Sodium 138 135 - 146 mmol/L   Potassium 4.4 3.5 - 5.3 mmol/L   Chloride  101 98 - 110 mmol/L   CO2 26 20 - 31 mmol/L   Glucose, Bld 100 (H) 65 - 99 mg/dL   BUN 17 7 - 25 mg/dL   Creat 0.75 0.960.60 - 0.451.35 mg1.61/dL   Total Bilirubin 0.3 0.2 - 1.2 mg/dL   Alkaline Phosphatase 63 40 - 115 U/L   AST 19 10 - 40 U/L   ALT 32 9 - 46 U/L   Total Protein 6.6 6.1 - 8.1 g/dL   Albumin 4.2 3.6 - 5.1 g/dL   Calcium 9.0 8.6 - 40.910.3 mg/dL   GFR, Est African American >89 >=60 mL/min   GFR, Est Non  African American >89 >=60 mL/min  Lipid Profile  Result Value Ref Range   Cholesterol 144 125 - 200 mg/dL   Triglycerides 811107 <914<150 mg/dL   HDL 46 >=78>=40 mg/dL   Total CHOL/HDL Ratio 3.1 <=5.0 Ratio   VLDL 21 <30 mg/dL   LDL Cholesterol 77 <295<130 mg/dL      Assessment & Plan:   Encounter Diagnosis  Name Primary?  . Hyperlipidemia Yes     -reviewed labs with pt  -continue current medications -f/u 6 months.  RTO sooner prn

## 2016-07-06 ENCOUNTER — Ambulatory Visit: Payer: Self-pay | Admitting: Physician Assistant

## 2016-07-06 ENCOUNTER — Ambulatory Visit (HOSPITAL_COMMUNITY)
Admission: RE | Admit: 2016-07-06 | Discharge: 2016-07-06 | Disposition: A | Discharge status: Home / Self Care | Payer: Self-pay | Source: Ambulatory Visit | Attending: Physician Assistant | Admitting: Physician Assistant

## 2016-07-06 ENCOUNTER — Encounter: Payer: Self-pay | Admitting: Physician Assistant

## 2016-07-06 VITALS — BP 126/72 | HR 65 | Temp 98.8°F | Wt 150.2 lb

## 2016-07-06 DIAGNOSIS — M79642 Pain in left hand: Secondary | ICD-10-CM | POA: Insufficient documentation

## 2016-07-06 DIAGNOSIS — W19XXXA Unspecified fall, initial encounter: Secondary | ICD-10-CM | POA: Insufficient documentation

## 2016-07-06 DIAGNOSIS — M795 Residual foreign body in soft tissue: Secondary | ICD-10-CM | POA: Insufficient documentation

## 2016-07-06 NOTE — Patient Instructions (Signed)
Hand Contusion  A hand contusion is a deep bruise on your hand area. Contusions are the result of an injury that caused bleeding under the skin. The contusion may turn blue, purple, or yellow. Minor injuries will give you a painless contusion, but more severe contusions may stay painful and swollen for a few weeks.  CAUSES   A contusion is usually caused by a blow, trauma, or direct force to an area of the body.  SYMPTOMS    Swelling and redness of the injured area.   Discoloration of the injured area.   Tenderness and soreness of the injured area.   Pain.  DIAGNOSIS   The diagnosis can be made by taking a history and performing a physical exam. An X-ray, CT scan, or MRI may be needed to determine if there were any associated injuries, such as broken bones (fractures).  TREATMENT   Often, the best treatment for a hand contusion is resting, elevating, icing, and applying cold compresses to the injured area. Over-the-counter medicines may also be recommended for pain control.  HOME CARE INSTRUCTIONS    Put ice on the injured area.    Put ice in a plastic bag.    Place a towel between your skin and the bag.    Leave the ice on for 15-20 minutes, 03-04 times a day.   Only take over-the-counter or prescription medicines as directed by your caregiver. Your caregiver may recommend avoiding anti-inflammatory medicines (aspirin, ibuprofen, and naproxen) for 48 hours because these medicines may increase bruising.   If told, use an elastic wrap as directed. This can help reduce swelling. You may remove the wrap for sleeping, showering, and bathing. If your fingers become numb, cold, or blue, take the wrap off and reapply it more loosely.   Elevate your hand with pillows to reduce swelling.   Avoid overusing your hand if it is painful.  SEEK IMMEDIATE MEDICAL CARE IF:    You have increased redness, swelling, or pain in your hand.   Your swelling or pain is not relieved with medicines.   You have loss of feeling in  your hand or are unable to move your fingers.   Your hand turns cold or blue.   You have pain when you move your fingers.   Your hand becomes warm to the touch.   Your contusion does not improve in 2 days.  MAKE SURE YOU:    Understand these instructions.   Will watch your condition.   Will get help right away if you are not doing well or get worse.     This information is not intended to replace advice given to you by your health care provider. Make sure you discuss any questions you have with your health care provider.     Document Released: 02/03/2002 Document Revised: 05/08/2012 Document Reviewed: 02/05/2012  [employer]vier Interactive Patient Education 2016 [employer]

## 2016-07-06 NOTE — Progress Notes (Signed)
BP 126/72 (BP Location: Left Arm, Patient Position: Sitting, Cuff Size: Normal)   Pulse 65   Temp 98.8 F (37.1 C)   Wt 150 lb 3.2 oz (68.1 kg)   SpO2 98%   BMI 26.19 kg/m    Subjective:    Patient ID: George Morales, male    DOB: 1971/07/12, 45 y.o.   MRN: [id number]  HPI: George SniffSotero Stolp is a 45 y.o. male presenting on 07/06/2016 for Hand Problem (L hand. pt states it is swollen and painful. pt has been taking otc pain relief med for the pain, but is not helpful. pt states 5th digit is numb. )   HPI   Chief Complaint  Patient presents with  . Hand Problem    L hand. pt states it is swollen and painful. pt has been taking otc pain relief med for the pain, but is not helpful. pt states 5th digit is numb.     Pt fell last Friday - slipped down the stairs  Relevant past medical, surgical, family and social history reviewed and updated as indicated. Interim medical history since our last visit reviewed. Allergies and medications reviewed and updated.   Current Outpatient Prescriptions:  .  simvastatin (ZOCOR) 20 MG tablet, Take 1 tablet (20 mg total) by mouth daily with breakfast. Tome una tableta por boca diaria, Disp: 30 tablet, Rfl: 6 .  Omega-3 Fatty Acids (FISH OIL) 1000 MG CAPS, Take 3 capsules by mouth at bedtime. , Disp: , Rfl:    Review of Systems  Constitutional: Positive for diaphoresis. Negative for appetite change, chills, fatigue, fever and unexpected weight change.  HENT: Negative for congestion, dental problem, drooling, ear pain, facial swelling, hearing loss, mouth sores, sneezing, sore throat, trouble swallowing and voice change.   Eyes: Negative for pain, discharge, redness, itching and visual disturbance.  Respiratory: Positive for cough. Negative for choking, shortness of breath and wheezing.   Cardiovascular: Negative for chest pain, palpitations and leg swelling.  Gastrointestinal: Negative for abdominal pain, blood in stool, constipation, diarrhea and  vomiting.  Endocrine: Negative for cold intolerance, heat intolerance and polydipsia.  Genitourinary: Negative for decreased urine volume, dysuria and hematuria.  Musculoskeletal: Positive for arthralgias. Negative for back pain and gait problem.  Skin: Negative for rash.  Allergic/Immunologic: Negative for environmental allergies.  Neurological: Negative for seizures, syncope, light-headedness and headaches.  Hematological: Negative for adenopathy.  Psychiatric/Behavioral: Negative for agitation, dysphoric mood and suicidal ideas. The patient is not nervous/anxious.     Per HPI unless specifically indicated above     Objective:    BP 126/72 (BP Location: Left Arm, Patient Position: Sitting, Cuff Size: Normal)   Pulse 65   Temp 98.8 F (37.1 C)   Wt 150 lb 3.2 oz (68.1 kg)   SpO2 98%   BMI 26.19 kg/m   Wt Readings from Last 3 Encounters:  07/06/16 150 lb 3.2 oz (68.1 kg)  05/03/16 146 lb 12.8 oz (66.6 kg)  04/24/16 144 lb 6.4 oz (65.5 kg)    Physical Exam  Constitutional: He is oriented to person, place, and time. He appears well-developed and well-nourished.  Musculoskeletal:       Left hand: He exhibits decreased range of motion and tenderness. He exhibits no bony tenderness, normal capillary refill, no deformity, no laceration and no swelling.  Mild non-point tenderness.  L 5th finger still and unable to fully flex  Neurological: He is alert and oriented to person, place, and time.  Skin: Skin is warm and  dry.  Psychiatric: He has a normal mood and affect. His behavior is normal.  Nursing note and vitals reviewed.       Assessment & Plan:   Encounter Diagnoses  Name Primary?  . Pain in left hand Yes  . Fall, initial encounter     -Doubt anything broken but pt would like to get xray to make sure.  Xray ordered. Will call with results -counseled pt to use ice and nsaids prn pain -pt given [employer] discount application -follow up as scheduled.  RTO sooner prn

## 2016-07-10 ENCOUNTER — Other Ambulatory Visit: Payer: Self-pay | Admitting: Physician Assistant

## 2016-07-25 ENCOUNTER — Other Ambulatory Visit: Payer: Self-pay | Admitting: Physician Assistant

## 2016-07-25 ENCOUNTER — Encounter: Payer: Self-pay | Admitting: Physician Assistant

## 2016-07-25 ENCOUNTER — Ambulatory Visit: Payer: Self-pay | Admitting: Physician Assistant

## 2016-07-25 VITALS — BP 92/60 | HR 62 | Temp 98.6°F | Ht 63.5 in | Wt 149.8 lb

## 2016-07-25 DIAGNOSIS — R0789 Other chest pain: Secondary | ICD-10-CM

## 2016-07-25 DIAGNOSIS — M79642 Pain in left hand: Secondary | ICD-10-CM

## 2016-07-25 MED ORDER — DICLOFENAC SODIUM 75 MG PO TBEC: 75.0000 mg | DELAYED_RELEASE_TABLET | Freq: Two times a day (BID) | ORAL | 2 refills | Status: DC

## 2016-07-25 NOTE — Progress Notes (Signed)
BP 92/60 (BP Location: Left Arm, Patient Position: Sitting, Cuff Size: Normal)   Pulse 62   Temp 98.6 F (37 C)   Ht 5' 3.5" (1.613 m)   Wt 149 lb 12.8 oz (67.9 kg)   SpO2 98%   BMI 26.12 kg/m    Subjective:    Patient ID: George Morales, male    DOB: Jul 26, 1971, 45 y.o.   MRN: [id number]  HPI: George Morales is a 45 y.o. male presenting on 07/25/2016 for Numbness   HPI  Pt does not work  Pt states pain in L hand persists.  He was seen for same thing earlier the month and had negative xray. He says that the pain now goes the entire length of his left 5th finger and extends up his arm and into his chest. He says hs CP started 3 days ago. No sob. States chest stayed "sore" most of the day yesterday.   Relevant past medical, surgical, family and social history reviewed and updated as indicated. Interim medical history since our last visit reviewed. Allergies and medications reviewed and updated.  CURRENT MEDS: Fish oil simvastatin  Review of Systems  Constitutional: Negative for appetite change, chills, diaphoresis, fatigue, fever and unexpected weight change.  HENT: Negative for congestion, dental problem, drooling, ear pain, facial swelling, hearing loss, mouth sores, sneezing, sore throat, trouble swallowing and voice change.   Eyes: Negative for pain, discharge, redness, itching and visual disturbance.  Respiratory: Negative for cough, choking, shortness of breath and wheezing.   Cardiovascular: Positive for chest pain. Negative for palpitations and leg swelling.  Gastrointestinal: Negative for abdominal pain, blood in stool, constipation, diarrhea and vomiting.  Endocrine: Negative for cold intolerance, heat intolerance and polydipsia.  Genitourinary: Negative for decreased urine volume, dysuria and hematuria.  Musculoskeletal: Positive for arthralgias. Negative for back pain and gait problem.  Skin: Negative for rash.  Allergic/Immunologic: Negative for environmental  allergies.  Neurological: Negative for seizures, syncope, light-headedness and headaches.  Hematological: Negative for adenopathy.  Psychiatric/Behavioral: Negative for agitation, dysphoric mood and suicidal ideas. The patient is not nervous/anxious.     Per HPI unless specifically indicated above     Objective:    BP 92/60 (BP Location: Left Arm, Patient Position: Sitting, Cuff Size: Normal)   Pulse 62   Temp 98.6 F (37 C)   Ht 5' 3.5" (1.613 m)   Wt 149 lb 12.8 oz (67.9 kg)   SpO2 98%   BMI 26.12 kg/m   Wt Readings from Last 3 Encounters:  07/25/16 149 lb 12.8 oz (67.9 kg)  07/06/16 150 lb 3.2 oz (68.1 kg)  05/03/16 146 lb 12.8 oz (66.6 kg)    Physical Exam  Constitutional: He is oriented to person, place, and time. He appears well-developed and well-nourished.  HENT:  Head: Normocephalic and atraumatic.  Neck: Neck supple.  Cardiovascular: Normal rate and regular rhythm.   Pulmonary/Chest: Effort normal and breath sounds normal. He has no decreased breath sounds. He has no wheezes. He has no rhonchi. He exhibits tenderness. He exhibits no mass, no bony tenderness and no crepitus.  + Left chest wall tenderness.  Pt states reproduces pain that he has been having past 3 days  Abdominal: Soft. Bowel sounds are normal. There is no hepatosplenomegaly. There is no tenderness.  Musculoskeletal: He exhibits no edema.       Left forearm: Normal.       Left hand: He exhibits tenderness. He exhibits normal range of motion, no bony tenderness, normal  capillary refill, no deformity and no swelling.  No point tenderness. Pt guards the 5th finger   Lymphadenopathy:    He has no cervical adenopathy.  Neurological: He is alert and oriented to person, place, and time.  Skin: Skin is warm and dry.  Psychiatric: He has a normal mood and affect. His behavior is normal.  Vitals reviewed.       Assessment & Plan:   Encounter Diagnoses  Name Primary?  . Pain in left hand Yes  . Chest  wall tenderness     Refer to orthopedics for hand. Pt turned in [employer] discount application earlier this month when he had his xray.  rx mobic for pain / inflammation

## 2016-10-23 ENCOUNTER — Other Ambulatory Visit: Payer: Self-pay | Admitting: Student

## 2016-10-23 DIAGNOSIS — E785 Hyperlipidemia, unspecified: Secondary | ICD-10-CM

## 2016-10-24 LAB — COMPREHENSIVE METABOLIC PANEL
ALT: 35 U/L (ref 9–46)
AST: 21 U/L (ref 10–40)
Albumin: 4.2 g/dL (ref 3.6–5.1)
Alkaline Phosphatase: 54 U/L (ref 40–115)
BUN: 12 mg/dL (ref 7–25)
CALCIUM: 9.2 mg/dL (ref 8.6–10.3)
CHLORIDE: 104 mmol/L (ref 98–110)
CO2: 26 mmol/L (ref 20–31)
Creat: 0.73 mg/dL (ref 0.60–1.35)
GLUCOSE: 99 mg/dL (ref 65–99)
POTASSIUM: 4.1 mmol/L (ref 3.5–5.3)
Sodium: 139 mmol/L (ref 135–146)
Total Bilirubin: 0.6 mg/dL (ref 0.2–1.2)
Total Protein: 6.6 g/dL (ref 6.1–8.1)

## 2016-10-24 LAB — LIPID PANEL
CHOL/HDL RATIO: 3.1 ratio (ref <5.0)
Cholesterol: 150 mg/dL (ref <200)
HDL: 49 mg/dL (ref >40)
LDL CALC: 82 mg/dL (ref <100)
Triglycerides: 94 mg/dL (ref <150)
VLDL: 19 mg/dL (ref <30)

## 2016-10-31 ENCOUNTER — Encounter: Payer: Self-pay | Admitting: Physician Assistant

## 2016-10-31 ENCOUNTER — Ambulatory Visit: Payer: Self-pay | Admitting: Physician Assistant

## 2016-10-31 VITALS — BP 96/60 | HR 62 | Temp 98.4°F | Ht 63.5 in | Wt 146.5 lb

## 2016-10-31 DIAGNOSIS — E785 Hyperlipidemia, unspecified: Secondary | ICD-10-CM

## 2016-10-31 NOTE — Progress Notes (Signed)
BP 96/60 (BP Location: Left Arm, Patient Position: Sitting, Cuff Size: Normal)   Pulse 62   Temp 98.4 F (36.9 C)   Ht 5' 3.5" (1.613 m)   Wt 146 lb 8 oz (66.5 kg)   SpO2 99%   BMI 25.54 kg/m    Subjective:    Patient ID: George Morales, male    DOB: 11/24/1970, 46 y.o.   MRN: [id number]  HPI: George Morales is a 46 y.o. male presenting on 10/31/2016 for Hyperlipidemia   HPI  Pt is doing well today. No complaints  Relevant past medical, surgical, family and social history reviewed and updated as indicated. Interim medical history since our last visit reviewed. Allergies and medications reviewed and updated.   Current Outpatient Prescriptions:  .  Omega-3 Fatty Acids (FISH OIL) 1000 MG CAPS, Take 3 capsules by mouth at bedtime. , Disp: , Rfl:  .  simvastatin (ZOCOR) 20 MG tablet, 1 po qd for cholesterol.  Tome una tableta por boca diaria, Disp: 30 tablet, Rfl: 6   Review of Systems  Constitutional: Negative for appetite change, chills, diaphoresis, fatigue, fever and unexpected weight change.  HENT: Negative for congestion, dental problem, drooling, ear pain, facial swelling, hearing loss, mouth sores, sneezing, sore throat, trouble swallowing and voice change.   Eyes: Negative for pain, discharge, redness, itching and visual disturbance.  Respiratory: Negative for cough, choking, shortness of breath and wheezing.   Cardiovascular: Negative for chest pain, palpitations and leg swelling.  Gastrointestinal: Negative for abdominal pain, blood in stool, constipation, diarrhea and vomiting.  Endocrine: Negative for cold intolerance, heat intolerance and polydipsia.  Genitourinary: Negative for decreased urine volume, dysuria and hematuria.  Musculoskeletal: Negative for arthralgias, back pain and gait problem.  Skin: Negative for rash.  Allergic/Immunologic: Negative for environmental allergies.  Neurological: Negative for seizures, syncope, light-headedness and headaches.   Hematological: Negative for adenopathy.  Psychiatric/Behavioral: Negative for agitation, dysphoric mood and suicidal ideas. The patient is not nervous/anxious.     Per HPI unless specifically indicated above     Objective:    BP 96/60 (BP Location: Left Arm, Patient Position: Sitting, Cuff Size: Normal)   Pulse 62   Temp 98.4 F (36.9 C)   Ht 5' 3.5" (1.613 m)   Wt 146 lb 8 oz (66.5 kg)   SpO2 99%   BMI 25.54 kg/m   Wt Readings from Last 3 Encounters:  10/31/16 146 lb 8 oz (66.5 kg)  07/25/16 149 lb 12.8 oz (67.9 kg)  07/06/16 150 lb 3.2 oz (68.1 kg)    Physical Exam  Constitutional: He is oriented to person, place, and time. He appears well-developed and well-nourished.  HENT:  Head: Normocephalic and atraumatic.  Neck: Neck supple.  Cardiovascular: Normal rate and regular rhythm.   Pulmonary/Chest: Effort normal and breath sounds normal. He has no wheezes.  Abdominal: Soft. Bowel sounds are normal. There is no hepatosplenomegaly. There is no tenderness.  Musculoskeletal: He exhibits no edema.  Lymphadenopathy:    He has no cervical adenopathy.  Neurological: He is alert and oriented to person, place, and time.  Skin: Skin is warm and dry.  Psychiatric: He has a normal mood and affect. His behavior is normal.  Vitals reviewed.   Results for orders placed or performed in visit on 10/23/16  Comprehensive Metabolic Panel (CMET)  Result Value Ref Range   Sodium 139 135 - 146 mmol/L   Potassium 4.1 3.5 - 5.3 mmol/L   Chloride 104 98 - 110 mmol/L  CO2 26 20 - 31 mmol/L   Glucose, Bld 99 65 - 99 mg/dL   BUN 12 7 - 25 mg/dL   Creat 4.090.73 8.110.60 - 9.141.35 mg/dL   Total Bilirubin 0.6 0.2 - 1.2 mg/dL   Alkaline Phosphatase 54 40 - 115 U/L   AST 21 10 - 40 U/L   ALT 35 9 - 46 U/L   Total Protein 6.6 6.1 - 8.1 g/dL   Albumin 4.2 3.6 - 5.1 g/dL   Calcium 9.2 8.6 - 78.210.3 mg/dL  Lipid Profile  Result Value Ref Range   Cholesterol 150 <200 mg/dL   Triglycerides 94 <956<150 mg/dL    HDL 49 >21>40 mg/dL   Total CHOL/HDL Ratio 3.1 <5.0 Ratio   VLDL 19 <30 mg/dL   LDL Cholesterol 82 <308<100 mg/dL      Assessment & Plan:   Encounter Diagnosis  Name Primary?  . Hyperlipidemia, unspecified hyperlipidemia type Yes     -Reviewed labs with pt -Continue current medications -F/u 6 months.  RTO sooner prn

## 2017-02-05 ENCOUNTER — Other Ambulatory Visit: Payer: Self-pay | Admitting: Physician Assistant

## 2017-02-22 ENCOUNTER — Other Ambulatory Visit: Payer: Self-pay | Admitting: Physician Assistant

## 2017-02-22 DIAGNOSIS — E785 Hyperlipidemia, unspecified: Secondary | ICD-10-CM

## 2017-04-23 ENCOUNTER — Other Ambulatory Visit (HOSPITAL_COMMUNITY)
Admission: RE | Admit: 2017-04-23 | Discharge: 2017-04-23 | Disposition: A | Discharge status: Home / Self Care | Payer: Self-pay | Source: Ambulatory Visit | Attending: Physician Assistant | Admitting: Physician Assistant

## 2017-04-23 DIAGNOSIS — E785 Hyperlipidemia, unspecified: Secondary | ICD-10-CM | POA: Insufficient documentation

## 2017-04-23 LAB — COMPREHENSIVE METABOLIC PANEL
ALBUMIN: 4.3 g/dL (ref 3.5–5.0)
ALK PHOS: 57 U/L (ref 38–126)
ALT: 35 U/L (ref 17–63)
ANION GAP: 9 (ref 5–15)
AST: 26 U/L (ref 15–41)
BUN: 17 mg/dL (ref 6–20)
CALCIUM: 9.3 mg/dL (ref 8.9–10.3)
CO2: 26 mmol/L (ref 22–32)
Chloride: 105 mmol/L (ref 101–111)
Creatinine, Ser: 0.7 mg/dL (ref 0.61–1.24)
GFR calc Af Amer: >60 mL/min (ref >60)
GFR calc non Af Amer: >60 mL/min (ref >60)
GLUCOSE: 110 mg/dL — AB (ref 65–99)
Potassium: 4.2 mmol/L (ref 3.5–5.1)
SODIUM: 140 mmol/L (ref 135–145)
Total Bilirubin: 0.6 mg/dL (ref 0.3–1.2)
Total Protein: 6.8 g/dL (ref 6.5–8.1)

## 2017-04-23 LAB — LIPID PANEL
CHOL/HDL RATIO: 3 ratio
Cholesterol: 140 mg/dL (ref 0–200)
HDL: 47 mg/dL (ref >40)
LDL CALC: 80 mg/dL (ref 0–99)
Triglycerides: 65 mg/dL (ref <150)
VLDL: 13 mg/dL (ref 0–40)

## 2017-05-02 ENCOUNTER — Encounter: Payer: Self-pay | Admitting: Physician Assistant

## 2017-05-02 ENCOUNTER — Ambulatory Visit: Payer: Self-pay | Admitting: Physician Assistant

## 2017-05-02 VITALS — BP 100/70 | HR 72 | Temp 98.2°F | Ht 63.5 in | Wt 153.0 lb

## 2017-05-02 DIAGNOSIS — E785 Hyperlipidemia, unspecified: Secondary | ICD-10-CM

## 2017-05-02 NOTE — Progress Notes (Signed)
BP 100/70 (BP Location: Left Arm, Patient Position: Sitting, Cuff Size: Normal)   Pulse 72   Temp 98.2 F (36.8 C)   Ht 5' 3.5" (1.613 m)   Wt 153 lb (69.4 kg)   SpO2 99%   BMI 26.68 kg/m    Subjective:    Patient ID: George Morales, male    DOB: 11/26/1970, 46 y.o.   MRN: [id number]  HPI: George Morales is a 46 y.o. male presenting on 05/02/2017 for Hyperlipidemia   HPI   Pt doing well.  No complaints  Relevant past medical, surgical, family and social history reviewed and updated as indicated. Interim medical history since our last visit reviewed. Allergies and medications reviewed and updated.   Current Outpatient Prescriptions:  .  Omega-3 Fatty Acids (FISH OIL) 1000 MG CAPS, Take 3 capsules by mouth at bedtime. , Disp: , Rfl:  .  simvastatin (ZOCOR) 20 MG tablet, 1 po qd for cholesterol.  Tome una tableta por boca diaria, Disp: 30 tablet, Rfl: 6   Review of Systems  Constitutional: Negative for appetite change, chills, diaphoresis, fatigue, fever and unexpected weight change.  HENT: Negative for congestion, dental problem, drooling, ear pain, facial swelling, hearing loss, mouth sores, sneezing, sore throat, trouble swallowing and voice change.   Eyes: Negative for pain, discharge, redness, itching and visual disturbance.  Respiratory: Negative for cough, choking, shortness of breath and wheezing.   Cardiovascular: Negative for chest pain, palpitations and leg swelling.  Gastrointestinal: Negative for abdominal pain, blood in stool, constipation, diarrhea and vomiting.  Endocrine: Negative for cold intolerance, heat intolerance and polydipsia.  Genitourinary: Negative for decreased urine volume, dysuria and hematuria.  Musculoskeletal: Negative for arthralgias, back pain and gait problem.  Skin: Negative for rash.  Allergic/Immunologic: Negative for environmental allergies.  Neurological: Negative for seizures, syncope, light-headedness and headaches.  Hematological:  Negative for adenopathy.  Psychiatric/Behavioral: Negative for agitation, dysphoric mood and suicidal ideas. The patient is not nervous/anxious.     Per HPI unless specifically indicated above     Objective:    BP 100/70 (BP Location: Left Arm, Patient Position: Sitting, Cuff Size: Normal)   Pulse 72   Temp 98.2 F (36.8 C)   Ht 5' 3.5" (1.613 m)   Wt 153 lb (69.4 kg)   SpO2 99%   BMI 26.68 kg/m   Wt Readings from Last 3 Encounters:  05/02/17 153 lb (69.4 kg)  10/31/16 146 lb 8 oz (66.5 kg)  07/25/16 149 lb 12.8 oz (67.9 kg)    Physical Exam  Constitutional: He is oriented to person, place, and time. He appears well-developed and well-nourished.  HENT:  Head: Normocephalic and atraumatic.  Neck: Neck supple.  Cardiovascular: Normal rate and regular rhythm.   Pulmonary/Chest: Effort normal and breath sounds normal. He has no wheezes.  Abdominal: Soft. Bowel sounds are normal. There is no hepatosplenomegaly. There is no tenderness.  Musculoskeletal: He exhibits no edema.  Lymphadenopathy:    He has no cervical adenopathy.  Neurological: He is alert and oriented to person, place, and time.  Skin: Skin is warm and dry.  Psychiatric: He has a normal mood and affect. His behavior is normal.  Vitals reviewed.   Results for orders placed or performed during the hospital encounter of 04/23/17  Comprehensive metabolic panel  Result Value Ref Range   Sodium 140 135 - 145 mmol/L   Potassium 4.2 3.5 - 5.1 mmol/L   Chloride 105 101 - 111 mmol/L   CO2 26 22 -  32 mmol/L   Glucose, Bld 110 (H) 65 - 99 mg/dL   BUN 17 6 - 20 mg/dL   Creatinine, Ser 5.360.70 0.61 - 1.24 mg/dL   Calcium 9.3 8.9 - 64.410.3 mg/dL   Total Protein 6.8 6.5 - 8.1 g/dL   Albumin 4.3 3.5 - 5.0 g/dL   AST 26 15 - 41 U/L   ALT 35 17 - 63 U/L   Alkaline Phosphatase 57 38 - 126 U/L   Total Bilirubin 0.6 0.3 - 1.2 mg/dL   GFR calc non Af Amer >60 >60 mL/min   GFR calc Af Amer >60 >60 mL/min   Anion gap 9 5 - 15   Lipid panel  Result Value Ref Range   Cholesterol 140 0 - 200 mg/dL   Triglycerides 65 <034<150 mg/dL   HDL 47 >74>40 mg/dL   Total CHOL/HDL Ratio 3.0 RATIO   VLDL 13 0 - 40 mg/dL   LDL Cholesterol 80 0 - 99 mg/dL      Assessment & Plan:   Encounter Diagnosis  Name Primary?  . Hyperlipidemia, unspecified hyperlipidemia type Yes     -reviewed labs with pt -pt to continue current medication -pt to follow up 6 months.  RTO sooner prn

## 2017-09-16 ENCOUNTER — Other Ambulatory Visit: Payer: Self-pay | Admitting: Physician Assistant

## 2017-09-16 MED ORDER — SIMVASTATIN 20 MG PO TABS: ORAL_TABLET | ORAL | 6 refills | Status: DC

## 2017-10-30 ENCOUNTER — Encounter: Payer: Self-pay | Admitting: Physician Assistant

## 2017-10-30 ENCOUNTER — Other Ambulatory Visit (HOSPITAL_COMMUNITY)
Admission: RE | Admit: 2017-10-30 | Discharge: 2017-10-30 | Disposition: A | Discharge status: Home / Self Care | Payer: Self-pay | Source: Ambulatory Visit | Attending: Physician Assistant | Admitting: Physician Assistant

## 2017-10-30 ENCOUNTER — Ambulatory Visit: Payer: Self-pay | Admitting: Physician Assistant

## 2017-10-30 VITALS — BP 105/70 | HR 58 | Temp 98.8°F | Ht 63.5 in | Wt 148.8 lb

## 2017-10-30 DIAGNOSIS — E785 Hyperlipidemia, unspecified: Secondary | ICD-10-CM | POA: Insufficient documentation

## 2017-10-30 LAB — COMPREHENSIVE METABOLIC PANEL
ALBUMIN: 4.4 g/dL (ref 3.5–5.0)
ALK PHOS: 57 U/L (ref 38–126)
ALT: 45 U/L (ref 17–63)
AST: 29 U/L (ref 15–41)
Anion gap: 9 (ref 5–15)
BILIRUBIN TOTAL: 0.6 mg/dL (ref 0.3–1.2)
BUN: 14 mg/dL (ref 6–20)
CALCIUM: 9.4 mg/dL (ref 8.9–10.3)
CO2: 26 mmol/L (ref 22–32)
Chloride: 103 mmol/L (ref 101–111)
Creatinine, Ser: 0.76 mg/dL (ref 0.61–1.24)
GFR calc Af Amer: >60 mL/min (ref >60)
GFR calc non Af Amer: >60 mL/min (ref >60)
GLUCOSE: 102 mg/dL — AB (ref 65–99)
Potassium: 4 mmol/L (ref 3.5–5.1)
SODIUM: 138 mmol/L (ref 135–145)
TOTAL PROTEIN: 7.2 g/dL (ref 6.5–8.1)

## 2017-10-30 LAB — LIPID PANEL
Cholesterol: 155 mg/dL (ref 0–200)
HDL: 52 mg/dL (ref >40)
LDL CALC: 85 mg/dL (ref 0–99)
Total CHOL/HDL Ratio: 3 RATIO
Triglycerides: 89 mg/dL (ref <150)
VLDL: 18 mg/dL (ref 0–40)

## 2017-10-30 NOTE — Progress Notes (Signed)
BP 105/70 (BP Location: Right Arm, Patient Position: Sitting, Cuff Size: Normal)   Pulse (!) 58   Temp 98.8 F (37.1 C)   Ht 5' 3.5" (1.613 m)   Wt 148 lb 12 oz (67.5 kg)   SpO2 98%   BMI 25.94 kg/m    Subjective:    Patient ID: George Morales, male    DOB: 12/29/1970, 47 y.o.   MRN: [id number]  HPI: George SniffSotero Mallicoat is a 47 y.o. male presenting on 10/30/2017 for Hyperlipidemia   HPI   Pt is doing well and has no complaints.  He did not get his labs drawn.  Relevant past medical, surgical, family and social history reviewed and updated as indicated. Interim medical history since our last visit reviewed. Allergies and medications reviewed and updated.   Current Outpatient Medications:  .  Omega-3 Fatty Acids (FISH OIL) 1000 MG CAPS, Take 4 capsules by mouth at bedtime. , Disp: , Rfl:  .  simvastatin (ZOCOR) 20 MG tablet, 1 po qd for cholesterol.  Tome una tableta por boca diaria, Disp: 30 tablet, Rfl: 6   Review of Systems  Constitutional: Negative for appetite change, chills, diaphoresis, fatigue, fever and unexpected weight change.  HENT: Negative for congestion, dental problem, drooling, ear pain, facial swelling, hearing loss, mouth sores, sneezing, sore throat, trouble swallowing and voice change.   Eyes: Negative for pain, discharge, redness, itching and visual disturbance.  Respiratory: Negative for cough, choking, shortness of breath and wheezing.   Cardiovascular: Negative for chest pain, palpitations and leg swelling.  Gastrointestinal: Negative for abdominal pain, blood in stool, constipation, diarrhea and vomiting.  Endocrine: Negative for cold intolerance, heat intolerance and polydipsia.  Genitourinary: Negative for decreased urine volume, dysuria and hematuria.  Musculoskeletal: Negative for arthralgias, back pain and gait problem.  Skin: Negative for rash.  Allergic/Immunologic: Negative for environmental allergies.  Neurological: Negative for seizures, syncope,  light-headedness and headaches.  Hematological: Negative for adenopathy.  Psychiatric/Behavioral: Negative for agitation, dysphoric mood and suicidal ideas. The patient is not nervous/anxious.     Per HPI unless specifically indicated above     Objective:    BP 105/70 (BP Location: Right Arm, Patient Position: Sitting, Cuff Size: Normal)   Pulse (!) 58   Temp 98.8 F (37.1 C)   Ht 5' 3.5" (1.613 m)   Wt 148 lb 12 oz (67.5 kg)   SpO2 98%   BMI 25.94 kg/m   Wt Readings from Last 3 Encounters:  10/30/17 148 lb 12 oz (67.5 kg)  05/02/17 153 lb (69.4 kg)  10/31/16 146 lb 8 oz (66.5 kg)    Physical Exam  Constitutional: He is oriented to person, place, and time. He appears well-developed and well-nourished.  HENT:  Head: Normocephalic and atraumatic.  Neck: Neck supple.  Cardiovascular: Normal rate and regular rhythm.  Pulmonary/Chest: Effort normal and breath sounds normal. He has no wheezes.  Abdominal: Soft. Bowel sounds are normal. There is no hepatosplenomegaly. There is no tenderness.  Musculoskeletal: He exhibits no edema.  Lymphadenopathy:    He has no cervical adenopathy.  Neurological: He is alert and oriented to person, place, and time.  Skin: Skin is warm and dry.  Psychiatric: He has a normal mood and affect. His behavior is normal.  Vitals reviewed.       Assessment & Plan:   Encounter Diagnosis  Name Primary?  . Hyperlipidemia, unspecified hyperlipidemia type Yes     -pt to Get labs drawn today when leaves office.  Will  call results -pt to Continue current meds -Follow up 6 months.  RTO sooner prn

## 2018-04-22 ENCOUNTER — Other Ambulatory Visit: Payer: Self-pay | Admitting: Physician Assistant

## 2018-05-01 ENCOUNTER — Other Ambulatory Visit (HOSPITAL_COMMUNITY)
Admission: RE | Admit: 2018-05-01 | Discharge: 2018-05-01 | Disposition: A | Discharge status: Home / Self Care | Payer: Self-pay | Source: Ambulatory Visit | Attending: Physician Assistant | Admitting: Physician Assistant

## 2018-05-01 ENCOUNTER — Encounter: Payer: Self-pay | Admitting: Physician Assistant

## 2018-05-01 ENCOUNTER — Ambulatory Visit: Payer: Self-pay | Admitting: Physician Assistant

## 2018-05-01 VITALS — BP 106/76 | HR 53 | Temp 98.2°F | Ht 63.5 in | Wt 145.0 lb

## 2018-05-01 DIAGNOSIS — E785 Hyperlipidemia, unspecified: Secondary | ICD-10-CM

## 2018-05-01 LAB — COMPREHENSIVE METABOLIC PANEL
ALBUMIN: 4.3 g/dL (ref 3.5–5.0)
ALT: 33 U/L (ref 0–44)
AST: 21 U/L (ref 15–41)
Alkaline Phosphatase: 51 U/L (ref 38–126)
Anion gap: 6 (ref 5–15)
BUN: 17 mg/dL (ref 6–20)
CALCIUM: 9.2 mg/dL (ref 8.9–10.3)
CHLORIDE: 107 mmol/L (ref 98–111)
CO2: 28 mmol/L (ref 22–32)
CREATININE: 0.7 mg/dL (ref 0.61–1.24)
GFR calc Af Amer: >60 mL/min (ref >60)
Glucose, Bld: 103 mg/dL — ABNORMAL HIGH (ref 70–99)
Potassium: 3.9 mmol/L (ref 3.5–5.1)
Sodium: 141 mmol/L (ref 135–145)
Total Bilirubin: 0.7 mg/dL (ref 0.3–1.2)
Total Protein: 7.3 g/dL (ref 6.5–8.1)

## 2018-05-01 LAB — LIPID PANEL
Cholesterol: 160 mg/dL (ref 0–200)
HDL: 47 mg/dL (ref >40)
LDL Cholesterol: 92 mg/dL (ref 0–99)
Total CHOL/HDL Ratio: 3.4 RATIO
Triglycerides: 107 mg/dL (ref <150)
VLDL: 21 mg/dL (ref 0–40)

## 2018-05-01 NOTE — Progress Notes (Signed)
   BP 106/76 (BP Location: Left Arm, Patient Position: Sitting, Cuff Size: Normal)   Pulse (!) 53   Temp 98.2 F (36.8 C)   Ht 5' 3.5" (1.613 m)   Wt 145 lb (65.8 kg)   SpO2 98%   BMI 25.28 kg/m    Subjective:    Patient ID: George Morales, male    DOB: 08-16-1971, 47 y.o.   MRN: [id number]  HPI: George Morales is a 47 y.o. male presenting on 05/01/2018 for Hyperlipidemia   HPI   Pt is doing well today and has no complaints  Relevant past medical, surgical, family and social history reviewed and updated as indicated. Interim medical history since our last visit reviewed. Allergies and medications reviewed and updated.   Current Outpatient Medications:  .  Omega-3 Fatty Acids (FISH OIL) 1000 MG CAPS, Take 3 capsules by mouth at bedtime. , Disp: , Rfl:  .  simvastatin (ZOCOR) 20 MG tablet, TAKE 1 TABLET BY MOUTH ONCE DAILY FOR CHOLESTEROL, Disp: 30 tablet, Rfl: 6   Review of Systems  Constitutional: Negative for appetite change, chills, diaphoresis, fatigue, fever and unexpected weight change.  HENT: Negative for congestion, dental problem, drooling, ear pain, facial swelling, hearing loss, mouth sores, sneezing, sore throat, trouble swallowing and voice change.   Eyes: Negative for pain, discharge, redness, itching and visual disturbance.  Respiratory: Negative for cough, choking, shortness of breath and wheezing.   Cardiovascular: Negative for chest pain, palpitations and leg swelling.  Gastrointestinal: Negative for abdominal pain, blood in stool, constipation, diarrhea and vomiting.  Endocrine: Negative for cold intolerance, heat intolerance and polydipsia.  Genitourinary: Negative for decreased urine volume, dysuria and hematuria.  Musculoskeletal: Negative for arthralgias, back pain and gait problem.  Skin: Negative for rash.  Allergic/Immunologic: Negative for environmental allergies.  Neurological: Negative for seizures, syncope, light-headedness and headaches.   Hematological: Negative for adenopathy.  Psychiatric/Behavioral: Negative for agitation, dysphoric mood and suicidal ideas. The patient is not nervous/anxious.     Per HPI unless specifically indicated above     Objective:    BP 106/76 (BP Location: Left Arm, Patient Position: Sitting, Cuff Size: Normal)   Pulse (!) 53   Temp 98.2 F (36.8 C)   Ht 5' 3.5" (1.613 m)   Wt 145 lb (65.8 kg)   SpO2 98%   BMI 25.28 kg/m   Wt Readings from Last 3 Encounters:  05/01/18 145 lb (65.8 kg)  10/30/17 148 lb 12 oz (67.5 kg)  05/02/17 153 lb (69.4 kg)    Physical Exam  Constitutional: He is oriented to person, place, and time. He appears well-developed and well-nourished.  HENT:  Head: Normocephalic and atraumatic.  Neck: Neck supple.  Cardiovascular: Normal rate and regular rhythm.  Pulmonary/Chest: Effort normal and breath sounds normal. He has no wheezes.  Abdominal: Soft. Bowel sounds are normal. There is no hepatosplenomegaly. There is no tenderness.  Musculoskeletal: He exhibits no edema.  Lymphadenopathy:    He has no cervical adenopathy.  Neurological: He is alert and oriented to person, place, and time.  Skin: Skin is warm and dry.  Psychiatric: He has a normal mood and affect. His behavior is normal.  Vitals reviewed.       Assessment & Plan:   Encounter Diagnosis  Name Primary?  . Hyperlipidemia, unspecified hyperlipidemia type Yes     -will check fasting labs -pt to continue current medications -pt to follow up in 6 months.  RTO sooner prn

## 2018-10-30 ENCOUNTER — Other Ambulatory Visit: Payer: Self-pay

## 2018-10-30 ENCOUNTER — Ambulatory Visit: Payer: Self-pay | Admitting: Physician Assistant

## 2018-10-30 ENCOUNTER — Other Ambulatory Visit (HOSPITAL_COMMUNITY)
Admission: RE | Admit: 2018-10-30 | Discharge: 2018-10-30 | Disposition: A | Discharge status: Home / Self Care | Payer: Self-pay | Source: Ambulatory Visit | Attending: Physician Assistant | Admitting: Physician Assistant

## 2018-10-30 ENCOUNTER — Encounter: Payer: Self-pay | Admitting: Physician Assistant

## 2018-10-30 VITALS — BP 102/68 | HR 68 | Temp 98.3°F | Ht 63.5 in | Wt 149.5 lb

## 2018-10-30 DIAGNOSIS — E785 Hyperlipidemia, unspecified: Secondary | ICD-10-CM

## 2018-10-30 LAB — COMPREHENSIVE METABOLIC PANEL
ALT: 30 U/L (ref 0–44)
AST: 22 U/L (ref 15–41)
Albumin: 4.3 g/dL (ref 3.5–5.0)
Alkaline Phosphatase: 49 U/L (ref 38–126)
Anion gap: 7 (ref 5–15)
BUN: 15 mg/dL (ref 6–20)
CHLORIDE: 106 mmol/L (ref 98–111)
CO2: 26 mmol/L (ref 22–32)
CREATININE: 0.68 mg/dL (ref 0.61–1.24)
Calcium: 9.3 mg/dL (ref 8.9–10.3)
GFR calc Af Amer: >60 mL/min (ref >60)
Glucose, Bld: 103 mg/dL — ABNORMAL HIGH (ref 70–99)
Potassium: 4.2 mmol/L (ref 3.5–5.1)
Sodium: 139 mmol/L (ref 135–145)
Total Bilirubin: 0.6 mg/dL (ref 0.3–1.2)
Total Protein: 6.9 g/dL (ref 6.5–8.1)

## 2018-10-30 LAB — LIPID PANEL
CHOLESTEROL: 152 mg/dL (ref 0–200)
HDL: 47 mg/dL (ref >40)
LDL CALC: 89 mg/dL (ref 0–99)
Total CHOL/HDL Ratio: 3.2 RATIO
Triglycerides: 82 mg/dL (ref <150)
VLDL: 16 mg/dL (ref 0–40)

## 2018-10-30 NOTE — Progress Notes (Signed)
BP 102/68   Pulse 68   Temp 98.3 F (36.8 C) (Oral)   Ht 5' 3.5" (1.613 m)   Wt 149 lb 8 oz (67.8 kg)   SpO2 96%   BMI 26.07 kg/m    Subjective:    Patient ID: George Morales, male    DOB: September 21, 1970, 48 y.o.   MRN: [id number]  HPI: George Morales is a 48 y.o. male presenting on 10/30/2018 for Hyperlipidemia   HPI   Pt is doing well today and has no complaints  Relevant past medical, surgical, family and social history reviewed and updated as indicated. Interim medical history since our last visit reviewed. Allergies and medications reviewed and updated.   Current Outpatient Medications:  .  Omega-3 Fatty Acids (FISH OIL) 1000 MG CAPS, Take 3 capsules by mouth at bedtime. , Disp: , Rfl:  .  simvastatin (ZOCOR) 20 MG tablet, TAKE 1 TABLET BY MOUTH ONCE DAILY FOR CHOLESTEROL, Disp: 30 tablet, Rfl: 6   Review of Systems  Constitutional: Negative for appetite change, chills, diaphoresis, fatigue, fever and unexpected weight change.  HENT: Negative for congestion, dental problem, drooling, ear pain, facial swelling, hearing loss, mouth sores, sneezing, sore throat, trouble swallowing and voice change.   Eyes: Negative for pain, discharge, redness, itching and visual disturbance.  Respiratory: Negative for cough, choking, shortness of breath and wheezing.   Cardiovascular: Negative for chest pain, palpitations and leg swelling.  Gastrointestinal: Negative for abdominal pain, blood in stool, constipation, diarrhea and vomiting.  Endocrine: Negative for cold intolerance, heat intolerance and polydipsia.  Genitourinary: Negative for decreased urine volume, dysuria and hematuria.  Musculoskeletal: Negative for arthralgias, back pain and gait problem.  Skin: Negative for rash.  Allergic/Immunologic: Negative for environmental allergies.  Neurological: Negative for seizures, syncope, light-headedness and headaches.  Hematological: Negative for adenopathy.  Psychiatric/Behavioral:  Negative for agitation, dysphoric mood and suicidal ideas. The patient is not nervous/anxious.     Per HPI unless specifically indicated above     Objective:    BP 102/68   Pulse 68   Temp 98.3 F (36.8 C) (Oral)   Ht 5' 3.5" (1.613 m)   Wt 149 lb 8 oz (67.8 kg)   SpO2 96%   BMI 26.07 kg/m   Wt Readings from Last 3 Encounters:  10/30/18 149 lb 8 oz (67.8 kg)  05/01/18 145 lb (65.8 kg)  10/30/17 148 lb 12 oz (67.5 kg)    Physical Exam Vitals signs reviewed.  Constitutional:      Appearance: He is well-developed.  HENT:     Head: Normocephalic and atraumatic.  Neck:     Musculoskeletal: Neck supple.  Cardiovascular:     Rate and Rhythm: Normal rate and regular rhythm.  Pulmonary:     Effort: Pulmonary effort is normal.     Breath sounds: Normal breath sounds. No wheezing.  Abdominal:     General: Bowel sounds are normal.     Palpations: Abdomen is soft.     Tenderness: There is no abdominal tenderness.  Lymphadenopathy:     Cervical: No cervical adenopathy.  Skin:    General: Skin is warm and dry.  Neurological:     Mental Status: He is alert and oriented to person, place, and time.  Psychiatric:        Behavior: Behavior normal.         Assessment & Plan:   Encounter Diagnosis  Name Primary?  . Hyperlipidemia, unspecified hyperlipidemia type Yes    -will check  fasting labs and call pt with results -no changes to medications today -Pt to follow up 6 months.  RTO sooner prn

## 2018-11-05 ENCOUNTER — Other Ambulatory Visit: Payer: Self-pay | Admitting: Physician Assistant

## 2019-04-16 ENCOUNTER — Other Ambulatory Visit: Payer: Self-pay | Admitting: Physician Assistant

## 2019-04-16 DIAGNOSIS — E785 Hyperlipidemia, unspecified: Secondary | ICD-10-CM

## 2019-04-21 ENCOUNTER — Other Ambulatory Visit (HOSPITAL_COMMUNITY)
Admission: RE | Admit: 2019-04-21 | Discharge: 2019-04-21 | Disposition: A | Discharge status: Home / Self Care | Payer: Self-pay | Source: Ambulatory Visit | Attending: Physician Assistant | Admitting: Physician Assistant

## 2019-04-21 DIAGNOSIS — E785 Hyperlipidemia, unspecified: Secondary | ICD-10-CM | POA: Insufficient documentation

## 2019-04-21 LAB — COMPREHENSIVE METABOLIC PANEL
ALT: 34 U/L (ref 0–44)
AST: 23 U/L (ref 15–41)
Albumin: 4.3 g/dL (ref 3.5–5.0)
Alkaline Phosphatase: 53 U/L (ref 38–126)
Anion gap: 6 (ref 5–15)
BUN: 22 mg/dL — ABNORMAL HIGH (ref 6–20)
CO2: 27 mmol/L (ref 22–32)
Calcium: 9.1 mg/dL (ref 8.9–10.3)
Chloride: 106 mmol/L (ref 98–111)
Creatinine, Ser: 0.73 mg/dL (ref 0.61–1.24)
GFR calc Af Amer: >60 mL/min (ref >60)
GFR calc non Af Amer: >60 mL/min (ref >60)
Glucose, Bld: 107 mg/dL — ABNORMAL HIGH (ref 70–99)
Potassium: 3.8 mmol/L (ref 3.5–5.1)
Sodium: 139 mmol/L (ref 135–145)
Total Bilirubin: 0.7 mg/dL (ref 0.3–1.2)
Total Protein: 6.8 g/dL (ref 6.5–8.1)

## 2019-04-21 LAB — LIPID PANEL
Cholesterol: 159 mg/dL (ref 0–200)
HDL: 52 mg/dL (ref >40)
LDL Cholesterol: 91 mg/dL (ref 0–99)
Total CHOL/HDL Ratio: 3.1 RATIO
Triglycerides: 81 mg/dL (ref <150)
VLDL: 16 mg/dL (ref 0–40)

## 2019-04-30 ENCOUNTER — Ambulatory Visit: Payer: Self-pay | Admitting: Physician Assistant

## 2019-04-30 ENCOUNTER — Encounter: Payer: Self-pay | Admitting: Physician Assistant

## 2019-04-30 DIAGNOSIS — E785 Hyperlipidemia, unspecified: Secondary | ICD-10-CM

## 2019-04-30 MED ORDER — SIMVASTATIN 20 MG PO TABS: 20.0000 mg | ORAL_TABLET | Freq: Every day | ORAL | 6 refills | Status: DC

## 2019-04-30 NOTE — Progress Notes (Signed)
There were no vitals taken for this visit.   Subjective:    Patient ID: George Morales, male    DOB: 06-21-1971, 48 y.o.   MRN: [id number]  HPI: George Morales is a 48 y.o. male presenting on 04/30/2019 for No chief complaint on file.   HPI    This is a telemedicine appointment due to coronavirus pandemic.  It is via Telephone as pt was unable to get the video to connect through [employer].  I connected with  Zerek Litsey on 04/30/19 by a video enabled telemedicine application and verified that I am speaking with the correct person using two identifiers.   I discussed the limitations of evaluation and management by telemedicine. The patient expressed understanding and agreed to proceed.  Pt is at home.  Provider is at office.     Pt says he is doing well.  He is wearing a mask when he goes out.  He is exercising regularly.  He is taking his medication.  He denies problems with anxiety or depression.     Relevant past medical, surgical, family and social history reviewed and updated as indicated. Interim medical history since our last visit reviewed. Allergies and medications reviewed and updated.   Current Outpatient Medications:  .  Omega-3 Fatty Acids (FISH OIL) 1000 MG CAPS, Take 3 capsules by mouth at bedtime. , Disp: , Rfl:  .  simvastatin (ZOCOR) 20 MG tablet, TAKE 1 TABLET BY MOUTH ONCE DAILY FOR CHOLESTEROL, Disp: 30 tablet, Rfl: 6    Review of Systems  Per HPI unless specifically indicated above     Objective:    There were no vitals taken for this visit.  Wt Readings from Last 3 Encounters:  10/30/18 149 lb 8 oz (67.8 kg)  05/01/18 145 lb (65.8 kg)  10/30/17 148 lb 12 oz (67.5 kg)    Physical Exam Pulmonary:     Effort: No respiratory distress.  Neurological:     Mental Status: He is alert and oriented to person, place, and time.  Psychiatric:        Attention and Perception: Attention normal.        Speech: Speech normal.        Behavior: Behavior is  cooperative.       Results for orders placed or performed during the hospital encounter of 04/21/19  Comprehensive metabolic panel  Result Value Ref Range   Sodium 139 135 - 145 mmol/L   Potassium 3.8 3.5 - 5.1 mmol/L   Chloride 106 98 - 111 mmol/L   CO2 27 22 - 32 mmol/L   Glucose, Bld 107 (H) 70 - 99 mg/dL   BUN 22 (H) 6 - 20 mg/dL   Creatinine, Ser 0.73 0.61 - 1.24 mg/dL   Calcium 9.1 8.9 - 10.3 mg/dL   Total Protein 6.8 6.5 - 8.1 g/dL   Albumin 4.3 3.5 - 5.0 g/dL   AST 23 15 - 41 U/L   ALT 34 0 - 44 U/L   Alkaline Phosphatase 53 38 - 126 U/L   Total Bilirubin 0.7 0.3 - 1.2 mg/dL   GFR calc non Af Amer >60 >60 mL/min   GFR calc Af Amer >60 >60 mL/min   Anion gap 6 5 - 15  Lipid panel  Result Value Ref Range   Cholesterol 159 0 - 200 mg/dL   Triglycerides 81 <150 mg/dL   HDL 52 >40 mg/dL   Total CHOL/HDL Ratio 3.1 RATIO   VLDL 16 0 - 40 mg/dL  LDL Cholesterol 91 0 - 99 mg/dL      Assessment & Plan:    Encounter Diagnosis  Name Primary?  . Hyperlipidemia, unspecified hyperlipidemia type Yes      -Reviewed labs with pt  -Pt to continue current medications -Pt to continue regular exercise -Pt to follow up 6 months.  He is to contact office sooner prn

## 2019-10-30 ENCOUNTER — Other Ambulatory Visit (HOSPITAL_COMMUNITY)
Admission: RE | Admit: 2019-10-30 | Discharge: 2019-10-30 | Disposition: A | Discharge status: Home / Self Care | Payer: Self-pay | Source: Ambulatory Visit | Attending: Physician Assistant | Admitting: Physician Assistant

## 2019-10-30 ENCOUNTER — Other Ambulatory Visit: Payer: Self-pay

## 2019-10-30 DIAGNOSIS — E785 Hyperlipidemia, unspecified: Secondary | ICD-10-CM | POA: Insufficient documentation

## 2019-10-30 LAB — COMPREHENSIVE METABOLIC PANEL
ALT: 49 U/L — ABNORMAL HIGH (ref 0–44)
AST: 28 U/L (ref 15–41)
Albumin: 4.3 g/dL (ref 3.5–5.0)
Alkaline Phosphatase: 53 U/L (ref 38–126)
Anion gap: 7 (ref 5–15)
BUN: 14 mg/dL (ref 6–20)
CO2: 28 mmol/L (ref 22–32)
Calcium: 9.2 mg/dL (ref 8.9–10.3)
Chloride: 104 mmol/L (ref 98–111)
Creatinine, Ser: 0.67 mg/dL (ref 0.61–1.24)
GFR calc Af Amer: >60 mL/min (ref >60)
GFR calc non Af Amer: >60 mL/min (ref >60)
Glucose, Bld: 104 mg/dL — ABNORMAL HIGH (ref 70–99)
Potassium: 4 mmol/L (ref 3.5–5.1)
Sodium: 139 mmol/L (ref 135–145)
Total Bilirubin: 0.7 mg/dL (ref 0.3–1.2)
Total Protein: 7.1 g/dL (ref 6.5–8.1)

## 2019-10-30 LAB — LIPID PANEL
Cholesterol: 167 mg/dL (ref 0–200)
HDL: 49 mg/dL (ref >40)
LDL Cholesterol: 92 mg/dL (ref 0–99)
Total CHOL/HDL Ratio: 3.4 RATIO
Triglycerides: 129 mg/dL (ref <150)
VLDL: 26 mg/dL (ref 0–40)

## 2019-11-03 ENCOUNTER — Ambulatory Visit: Payer: Self-pay | Admitting: Physician Assistant

## 2019-11-03 ENCOUNTER — Encounter: Payer: Self-pay | Admitting: Physician Assistant

## 2019-11-03 ENCOUNTER — Other Ambulatory Visit: Payer: Self-pay

## 2019-11-03 VITALS — BP 110/80 | HR 68 | Temp 97.7°F | Wt 148.1 lb

## 2019-11-03 DIAGNOSIS — E785 Hyperlipidemia, unspecified: Secondary | ICD-10-CM

## 2019-11-03 MED ORDER — SIMVASTATIN 20 MG PO TABS: 20.0000 mg | ORAL_TABLET | Freq: Every day | ORAL | 6 refills | Status: DC

## 2019-11-03 NOTE — Progress Notes (Signed)
BP 110/80   Pulse 68   Temp 97.7 F (36.5 C)   Wt 148 lb 1.6 oz (67.2 kg)   SpO2 98%   BMI 25.82 kg/m    Subjective:    Patient ID: George Morales, male    DOB: July 05, 1971, 49 y.o.   MRN: [id number]  HPI: George Morales is a 49 y.o. male presenting on 11/03/2019 for Hyperlipidemia   HPI    Pt had a negative covid 19 screening questionnaire.     Pt is a 54yoM with dyslipidemia.    Pt is not working at this time Pt is feeling well and has no complaints    Relevant past medical, surgical, family and social history reviewed and updated as indicated. Interim medical history since our last visit reviewed. Allergies and medications reviewed and updated.   Current Outpatient Medications:  .  Omega-3 Fatty Acids (FISH OIL) 1000 MG CAPS, Take 3 capsules by mouth at bedtime. , Disp: , Rfl:  .  simvastatin (ZOCOR) 20 MG tablet, Take 1 tablet (20 mg total) by mouth daily., Disp: 30 tablet, Rfl: 6    Review of Systems  Per HPI unless specifically indicated above     Objective:    BP 110/80   Pulse 68   Temp 97.7 F (36.5 C)   Wt 148 lb 1.6 oz (67.2 kg)   SpO2 98%   BMI 25.82 kg/m   Wt Readings from Last 3 Encounters:  11/03/19 148 lb 1.6 oz (67.2 kg)  10/30/18 149 lb 8 oz (67.8 kg)  05/01/18 145 lb (65.8 kg)    Physical Exam Vitals reviewed.  Constitutional:      General: He is not in acute distress.    Appearance: Normal appearance. He is well-developed. He is not ill-appearing.  HENT:     Head: Normocephalic and atraumatic.  Cardiovascular:     Rate and Rhythm: Normal rate and regular rhythm.  Pulmonary:     Effort: Pulmonary effort is normal.     Breath sounds: Normal breath sounds. No wheezing.  Abdominal:     General: Bowel sounds are normal.     Palpations: Abdomen is soft.     Tenderness: There is no abdominal tenderness.  Musculoskeletal:     Cervical back: Neck supple.     Right lower leg: No edema.     Left lower leg: No edema.   Lymphadenopathy:     Cervical: No cervical adenopathy.  Skin:    General: Skin is warm and dry.  Neurological:     Mental Status: He is alert and oriented to person, place, and time.  Psychiatric:        Attention and Perception: Attention normal.        Mood and Affect: Mood normal.        Speech: Speech normal.        Behavior: Behavior normal. Behavior is cooperative.     Results for orders placed or performed during the hospital encounter of 10/30/19  Lipid panel  Result Value Ref Range   Cholesterol 167 0 - 200 mg/dL   Triglycerides 129 <150 mg/dL   HDL 49 >40 mg/dL   Total CHOL/HDL Ratio 3.4 RATIO   VLDL 26 0 - 40 mg/dL   LDL Cholesterol 92 0 - 99 mg/dL  Comprehensive metabolic panel  Result Value Ref Range   Sodium 139 135 - 145 mmol/L   Potassium 4.0 3.5 - 5.1 mmol/L   Chloride 104 98 - 111 mmol/L  CO2 28 22 - 32 mmol/L   Glucose, Bld 104 (H) 70 - 99 mg/dL   BUN 14 6 - 20 mg/dL   Creatinine, Ser 0.60 0.61 - 1.24 mg/dL   Calcium 9.2 8.9 - 15.6 mg/dL   Total Protein 7.1 6.5 - 8.1 g/dL   Albumin 4.3 3.5 - 5.0 g/dL   AST 28 15 - 41 U/L   ALT 49 (H) 0 - 44 U/L   Alkaline Phosphatase 53 38 - 126 U/L   Total Bilirubin 0.7 0.3 - 1.2 mg/dL   GFR calc non Af Amer >60 >60 mL/min   GFR calc Af Amer >60 >60 mL/min   Anion gap 7 5 - 15      Assessment & Plan:    Encounter Diagnosis  Name Primary?  . Hyperlipidemia, unspecified hyperlipidemia type Yes     Reviewed labs with pt Pt to continue current medications Pt to follow up in 6 months.  He is to contact office sooner prn

## 2020-05-05 ENCOUNTER — Encounter: Payer: Self-pay | Admitting: Physician Assistant

## 2020-05-05 ENCOUNTER — Other Ambulatory Visit: Payer: Self-pay

## 2020-05-05 ENCOUNTER — Other Ambulatory Visit (HOSPITAL_COMMUNITY)
Admission: RE | Admit: 2020-05-05 | Discharge: 2020-05-05 | Disposition: A | Discharge status: Home / Self Care | Payer: Self-pay | Source: Ambulatory Visit | Attending: Physician Assistant | Admitting: Physician Assistant

## 2020-05-05 ENCOUNTER — Ambulatory Visit: Payer: Self-pay | Admitting: Physician Assistant

## 2020-05-05 VITALS — BP 106/60 | HR 60 | Temp 98.1°F | Ht 62.5 in | Wt 147.0 lb

## 2020-05-05 DIAGNOSIS — E785 Hyperlipidemia, unspecified: Secondary | ICD-10-CM | POA: Insufficient documentation

## 2020-05-05 LAB — LIPID PANEL
Cholesterol: 166 mg/dL (ref 0–200)
HDL: 42 mg/dL (ref >40)
LDL Cholesterol: 106 mg/dL — ABNORMAL HIGH (ref 0–99)
Total CHOL/HDL Ratio: 4 RATIO
Triglycerides: 89 mg/dL (ref <150)
VLDL: 18 mg/dL (ref 0–40)

## 2020-05-05 LAB — COMPREHENSIVE METABOLIC PANEL
ALT: 57 U/L — ABNORMAL HIGH (ref 0–44)
AST: 32 U/L (ref 15–41)
Albumin: 4.4 g/dL (ref 3.5–5.0)
Alkaline Phosphatase: 52 U/L (ref 38–126)
Anion gap: 9 (ref 5–15)
BUN: 20 mg/dL (ref 6–20)
CO2: 25 mmol/L (ref 22–32)
Calcium: 9.2 mg/dL (ref 8.9–10.3)
Chloride: 104 mmol/L (ref 98–111)
Creatinine, Ser: 0.82 mg/dL (ref 0.61–1.24)
GFR calc Af Amer: >60 mL/min (ref >60)
GFR calc non Af Amer: >60 mL/min (ref >60)
Glucose, Bld: 109 mg/dL — ABNORMAL HIGH (ref 70–99)
Potassium: 3.9 mmol/L (ref 3.5–5.1)
Sodium: 138 mmol/L (ref 135–145)
Total Bilirubin: 0.8 mg/dL (ref 0.3–1.2)
Total Protein: 7.1 g/dL (ref 6.5–8.1)

## 2020-05-05 MED ORDER — SIMVASTATIN 20 MG PO TABS: 20.0000 mg | ORAL_TABLET | Freq: Every day | ORAL | 6 refills | Status: DC

## 2020-05-05 NOTE — Progress Notes (Signed)
   BP 106/60   Pulse 60   Temp 98.1 F (36.7 C)   SpO2 97%    Subjective:    Patient ID: George Morales, male    DOB: Jun 07, 1971, 49 y.o.   MRN: [id number]  HPI: George Morales is a 49 y.o. male presenting on 05/05/2020 for No chief complaint on file.   HPI  Pt had a negative covid 19 screening questionnaire.    Pt is 49yoM with follow up for dyslipidemia.  He has no complaints.  He did not get his labs drawn because his phone number changed and he was unable to be contacted.    He does not work.     Relevant past medical, surgical, family and social history reviewed and updated as indicated. Interim medical history since our last visit reviewed. Allergies and medications reviewed and updated.   Current Outpatient Medications:  .  Omega-3 Fatty Acids (FISH OIL) 1000 MG CAPS, Take 3 capsules by mouth at bedtime. , Disp: , Rfl:  .  simvastatin (ZOCOR) 20 MG tablet, Take 1 tablet (20 mg total) by mouth daily., Disp: 30 tablet, Rfl: 6     Review of Systems  Per HPI unless specifically indicated above     Objective:    BP 106/60   Pulse 60   Temp 98.1 F (36.7 C)   SpO2 97%   Wt Readings from Last 3 Encounters:  11/03/19 148 lb 1.6 oz (67.2 kg)  10/30/18 149 lb 8 oz (67.8 kg)  05/01/18 145 lb (65.8 kg)    Physical Exam Vitals reviewed.  Constitutional:      Appearance: He is well-developed.  HENT:     Head: Normocephalic and atraumatic.  Cardiovascular:     Rate and Rhythm: Normal rate and regular rhythm.  Pulmonary:     Effort: Pulmonary effort is normal.     Breath sounds: Normal breath sounds. No wheezing.  Abdominal:     General: Bowel sounds are normal.     Palpations: Abdomen is soft.     Tenderness: There is no abdominal tenderness.  Musculoskeletal:     Cervical back: Neck supple.     Right lower leg: No edema.     Left lower leg: No edema.  Lymphadenopathy:     Cervical: No cervical adenopathy.  Skin:    General: Skin is warm and dry.   Neurological:     Mental Status: He is alert and oriented to person, place, and time.  Psychiatric:        Behavior: Behavior normal.         Assessment & Plan:   Encounter Diagnosis  Name Primary?  . Hyperlipidemia, unspecified hyperlipidemia type Yes       -pt to get fasting labs drawn.  He will be called with results -he is to update phone number -no changes to medications.  Refills sent to pharmacy -pt to follow up 6 months.  He is to contact office sooner prn

## 2020-10-19 ENCOUNTER — Other Ambulatory Visit: Payer: Self-pay | Admitting: Physician Assistant

## 2020-10-19 DIAGNOSIS — Z131 Encounter for screening for diabetes mellitus: Secondary | ICD-10-CM

## 2020-10-19 DIAGNOSIS — R7309 Other abnormal glucose: Secondary | ICD-10-CM

## 2020-10-19 DIAGNOSIS — E785 Hyperlipidemia, unspecified: Secondary | ICD-10-CM

## 2020-10-26 ENCOUNTER — Other Ambulatory Visit: Payer: Self-pay

## 2020-10-26 ENCOUNTER — Other Ambulatory Visit (HOSPITAL_COMMUNITY)
Admission: RE | Admit: 2020-10-26 | Discharge: 2020-10-26 | Disposition: A | Discharge status: Home / Self Care | Payer: Self-pay | Source: Ambulatory Visit | Attending: Physician Assistant | Admitting: Physician Assistant

## 2020-10-26 DIAGNOSIS — E785 Hyperlipidemia, unspecified: Secondary | ICD-10-CM | POA: Insufficient documentation

## 2020-10-26 DIAGNOSIS — Z131 Encounter for screening for diabetes mellitus: Secondary | ICD-10-CM | POA: Insufficient documentation

## 2020-10-26 DIAGNOSIS — R7309 Other abnormal glucose: Secondary | ICD-10-CM | POA: Insufficient documentation

## 2020-10-26 LAB — LIPID PANEL
Cholesterol: 162 mg/dL (ref 0–200)
HDL: 46 mg/dL (ref >40)
LDL Cholesterol: 94 mg/dL (ref 0–99)
Total CHOL/HDL Ratio: 3.5 RATIO
Triglycerides: 111 mg/dL (ref <150)
VLDL: 22 mg/dL (ref 0–40)

## 2020-10-26 LAB — COMPREHENSIVE METABOLIC PANEL
ALT: 46 U/L — ABNORMAL HIGH (ref 0–44)
AST: 26 U/L (ref 15–41)
Albumin: 4.2 g/dL (ref 3.5–5.0)
Alkaline Phosphatase: 49 U/L (ref 38–126)
Anion gap: 8 (ref 5–15)
BUN: 16 mg/dL (ref 6–20)
CO2: 26 mmol/L (ref 22–32)
Calcium: 8.7 mg/dL — ABNORMAL LOW (ref 8.9–10.3)
Chloride: 104 mmol/L (ref 98–111)
Creatinine, Ser: 0.87 mg/dL (ref 0.61–1.24)
GFR, Estimated: >60 mL/min (ref >60)
Glucose, Bld: 105 mg/dL — ABNORMAL HIGH (ref 70–99)
Potassium: 4.4 mmol/L (ref 3.5–5.1)
Sodium: 138 mmol/L (ref 135–145)
Total Bilirubin: 0.5 mg/dL (ref 0.3–1.2)
Total Protein: 6.8 g/dL (ref 6.5–8.1)

## 2020-10-26 LAB — HEMOGLOBIN A1C
Hgb A1c MFr Bld: 5.8 % — ABNORMAL HIGH (ref 4.8–5.6)
Mean Plasma Glucose: 119.76 mg/dL

## 2020-11-02 ENCOUNTER — Ambulatory Visit: Payer: Self-pay | Admitting: Physician Assistant

## 2020-11-02 ENCOUNTER — Encounter: Payer: Self-pay | Admitting: Physician Assistant

## 2020-11-02 ENCOUNTER — Other Ambulatory Visit: Payer: Self-pay

## 2020-11-02 VITALS — BP 124/62 | HR 63 | Temp 96.6°F | Wt 154.0 lb

## 2020-11-02 DIAGNOSIS — E785 Hyperlipidemia, unspecified: Secondary | ICD-10-CM

## 2020-11-02 DIAGNOSIS — R7303 Prediabetes: Secondary | ICD-10-CM

## 2020-11-02 MED ORDER — SIMVASTATIN 20 MG PO TABS: 20.0000 mg | ORAL_TABLET | Freq: Every day | ORAL | 6 refills | Status: DC

## 2020-11-02 NOTE — Patient Instructions (Signed)
Diabetes Care, 44(Suppl 1), S34-S39. https://doi.org/https://doi.org/10.2337/[email or web address]-[email or web address]">  Prediabetes Prediabetes is when your blood sugar (blood glucose) level is higher than normal but not high enough for you to be diagnosed with type 2 diabetes. Having prediabetes puts you at risk for developing type 2 diabetes (type 2 diabetes mellitus). With certain lifestyle changes, you may be able to prevent or delay the onset of type 2 diabetes. This is important because type 2 diabetes can lead to serious complications, such as:  Heart disease.  Stroke.  Blindness.  Kidney disease.  Depression.  Poor circulation in the feet and legs. In severe cases, this could lead to surgical removal of a leg (amputation). What are the causes? The exact cause of prediabetes is not known. It may result from insulin resistance. Insulin resistance develops when cells in the body do not respond properly to insulin that the body makes. This can cause excess glucose to build up in the blood. High blood glucose (hyperglycemia) can develop. What increases the risk? The following factors may make you more likely to develop this condition:  You have a family member with type 2 diabetes.  You are older than 45 years.  You had a temporary form of diabetes during a pregnancy (gestational diabetes).  You had polycystic ovary syndrome (PCOS).  You are overweight or obese.  You are inactive (sedentary).  You have a history of heart disease, including problems with cholesterol levels, high levels of blood fats, or high blood pressure. What are the signs or symptoms? You may have no symptoms. If you do have symptoms, they may include:  Increased hunger.  Increased thirst.  Increased urination.  Vision changes, such as blurry vision.  Tiredness (fatigue). How is this diagnosed? This condition can be diagnosed with blood tests. Your blood glucose may be checked with one or more of the following tests:  A  fasting blood glucose (FBG) test. You will not be allowed to eat (you will fast) for at least 8 hours before a blood sample is taken.  An A1C blood test (hemoglobin A1C). This test provides information about blood glucose levels over the previous 2?3 months.  An oral glucose tolerance test (OGTT). This test measures your blood glucose at two points in time: ? After fasting. This is your baseline level. ? Two hours after you drink a beverage that contains glucose. You may be diagnosed with prediabetes if:  Your FBG is 100?125 mg/dL (5.6-6.9 mmol/L).  Your A1C level is 5.7?6.4% (39-46 mmol/mol).  Your OGTT result is 140?199 mg/dL (7.8-11 mmol/L). These blood tests may be repeated to confirm your diagnosis.   How is this treated? Treatment may include dietary and lifestyle changes to help lower your blood glucose and prevent type 2 diabetes from developing. In some cases, medicine may be prescribed to help lower the risk of type 2 diabetes. Follow these instructions at home: Nutrition  Follow a healthy meal plan. This includes eating lean proteins, whole grains, legumes, fresh fruits and vegetables, low-fat dairy products, and healthy fats.  Follow instructions from your health care provider about eating or drinking restrictions.  Meet with a dietitian to create a healthy eating plan that is right for you.   Lifestyle  Do moderate-intensity exercise for at least 30 minutes a day on 5 or more days each week, or as told by your health care provider. A mix of activities may be best, such as: ? Brisk walking, swimming, biking, and weight lifting.  Lose weight as told by your health   care provider. Losing 5-7% of your body weight can reverse insulin resistance.  Do not drink alcohol if: ? Your health care provider tells you not to drink. ? You are pregnant, may be pregnant, or are planning to become pregnant.  If you drink alcohol: ? Limit how much you use to:  0-1 drink a day for  women.  0-2 drinks a day for men. ? Be aware of how much alcohol is in your drink. In the [place] one drink equals one 12 oz bottle of beer (355 mL), one 5 oz glass of wine (148 mL), or one 1 oz glass of hard liquor (44 mL). General instructions  Take over-the-counter and prescription medicines only as told by your health care provider. You may be prescribed medicines that help lower the risk of type 2 diabetes.  Do not use any products that contain nicotine or tobacco, such as cigarettes, e-cigarettes, and chewing tobacco. If you need help quitting, ask your health care provider.  Keep all follow-up visits. This is important. Where to find more information  American Diabetes Association: www.diabetes.org  Academy of Nutrition and Dietetics: www.eatright.org  American Heart Association: www.heart.org Contact a health care provider if:  You have any of these symptoms: ? Increased hunger. ? Increased urination. ? Increased thirst. ? Fatigue. ? Vision changes, such as blurry vision. Get help right away if you:  Have shortness of breath.  Feel confused.  Vomit or feel like you may vomit. Summary  Prediabetes is when your blood sugar (blood glucose)level is higher than normal but not high enough for you to be diagnosed with type 2 diabetes.  Having prediabetes puts you at risk for developing type 2 diabetes (type 2 diabetes mellitus).  Make lifestyle changes such as eating a healthy diet and exercising regularly to help prevent diabetes. Lose weight as told by your health care provider. This information is not intended to replace advice given to you by your health care provider. Make sure you discuss any questions you have with your health care provider. Document Revised: 11/13/2019 Document Reviewed: 11/13/2019 Elsevier Patient Education  2021 Elsevier Inc.  

## 2020-11-02 NOTE — Progress Notes (Signed)
BP 124/62   Pulse 63   Temp (!) 96.6 F (35.9 C)   Wt 154 lb (69.9 kg)   SpO2 97%   BMI 27.72 kg/m    Subjective:    Patient ID: George Morales, male    DOB: 19-Jul-1971, 50 y.o.   MRN: [id number]  HPI: George Morales is a 50 y.o. male presenting on 11/02/2020 for Hyperlipidemia   HPI  Pt had a negative covid 19 screening questionnaire.   Pt is 49yoM with routine appointment for follow up dyslipidemia.  He is not working.  He has gotten all 3 covid vaccinations.  He has no complaints.     Relevant past medical, surgical, family and social history reviewed and updated as indicated. Interim medical history since our last visit reviewed. Allergies and medications reviewed and updated.   Current Outpatient Medications:  .  Omega-3 Fatty Acids (FISH OIL) 1000 MG CAPS, Take 3 capsules by mouth at bedtime. , Disp: , Rfl:  .  simvastatin (ZOCOR) 20 MG tablet, Take 1 tablet (20 mg total) by mouth daily., Disp: 30 tablet, Rfl: 6    Review of Systems  Per HPI unless specifically indicated above     Objective:    BP 124/62   Pulse 63   Temp (!) 96.6 F (35.9 C)   Wt 154 lb (69.9 kg)   SpO2 97%   BMI 27.72 kg/m   Wt Readings from Last 3 Encounters:  11/02/20 154 lb (69.9 kg)  05/05/20 147 lb (66.7 kg)  11/03/19 148 lb 1.6 oz (67.2 kg)    Physical Exam Vitals reviewed.  Constitutional:      Appearance: He is well-developed and well-nourished.  HENT:     Head: Normocephalic and atraumatic.  Cardiovascular:     Rate and Rhythm: Normal rate and regular rhythm.  Pulmonary:     Effort: Pulmonary effort is normal.     Breath sounds: Normal breath sounds. No wheezing.  Abdominal:     General: Bowel sounds are normal.     Palpations: Abdomen is soft. There is no hepatosplenomegaly.     Tenderness: There is no abdominal tenderness.  Musculoskeletal:        General: No edema.     Cervical back: Neck supple.  Lymphadenopathy:     Cervical: No cervical adenopathy.   Skin:    General: Skin is warm and dry.  Neurological:     Mental Status: He is alert and oriented to person, place, and time.  Psychiatric:        Mood and Affect: Mood and affect normal.        Behavior: Behavior normal.     Results for orders placed or performed during the hospital encounter of 10/26/20  Hemoglobin A1c  Result Value Ref Range   Hgb A1c MFr Bld 5.8 (H) 4.8 - 5.6 %   Mean Plasma Glucose 119.76 mg/dL  Lipid panel  Result Value Ref Range   Cholesterol 162 0 - 200 mg/dL   Triglycerides 408 <144 mg/dL   HDL 46 >81 mg/dL   Total CHOL/HDL Ratio 3.5 RATIO   VLDL 22 0 - 40 mg/dL   LDL Cholesterol 94 0 - 99 mg/dL  Comprehensive metabolic panel  Result Value Ref Range   Sodium 138 135 - 145 mmol/L   Potassium 4.4 3.5 - 5.1 mmol/L   Chloride 104 98 - 111 mmol/L   CO2 26 22 - 32 mmol/L   Glucose, Bld 105 (H) 70 - 99 mg/dL  BUN 16 6 - 20 mg/dL   Creatinine, Ser 2.44 0.61 - 1.24 mg/dL   Calcium 8.7 (L) 8.9 - 10.3 mg/dL   Total Protein 6.8 6.5 - 8.1 g/dL   Albumin 4.2 3.5 - 5.0 g/dL   AST 26 15 - 41 U/L   ALT 46 (H) 0 - 44 U/L   Alkaline Phosphatase 49 38 - 126 U/L   Total Bilirubin 0.5 0.3 - 1.2 mg/dL   GFR, Estimated >01 >02 mL/min   Anion gap 8 5 - 15      Assessment & Plan:    Encounter Diagnoses  Name Primary?  . Hyperlipidemia, unspecified hyperlipidemia type Yes  . Prediabetes      -Reviewed labs with pt -Pt to continue current medications -Pt is counseled on prediabetes and given reading information -pt to follow up 6 months.  He is to contact office sooner prn

## 2021-04-25 ENCOUNTER — Other Ambulatory Visit: Payer: Self-pay | Admitting: Physician Assistant

## 2021-04-25 DIAGNOSIS — E785 Hyperlipidemia, unspecified: Secondary | ICD-10-CM

## 2021-05-04 ENCOUNTER — Other Ambulatory Visit (HOSPITAL_COMMUNITY)
Admission: RE | Admit: 2021-05-04 | Discharge: 2021-05-04 | Disposition: A | Discharge status: Home / Self Care | Payer: Self-pay | Source: Ambulatory Visit | Attending: Physician Assistant | Admitting: Physician Assistant

## 2021-05-04 DIAGNOSIS — E785 Hyperlipidemia, unspecified: Secondary | ICD-10-CM

## 2021-05-04 LAB — LIPID PANEL
Cholesterol: 162 mg/dL (ref 0–200)
HDL: 48 mg/dL (ref >40)
LDL Cholesterol: 103 mg/dL — ABNORMAL HIGH (ref 0–99)
Total CHOL/HDL Ratio: 3.4 RATIO
Triglycerides: 56 mg/dL (ref <150)
VLDL: 11 mg/dL (ref 0–40)

## 2021-05-04 LAB — HEPATIC FUNCTION PANEL
ALT: 48 U/L — ABNORMAL HIGH (ref 0–44)
AST: 31 U/L (ref 15–41)
Albumin: 4.2 g/dL (ref 3.5–5.0)
Alkaline Phosphatase: 53 U/L (ref 38–126)
Bilirubin, Direct: 0.1 mg/dL (ref 0.0–0.2)
Indirect Bilirubin: 0.4 mg/dL (ref 0.3–0.9)
Total Bilirubin: 0.5 mg/dL (ref 0.3–1.2)
Total Protein: 6.6 g/dL (ref 6.5–8.1)

## 2021-05-05 ENCOUNTER — Other Ambulatory Visit: Payer: Self-pay | Admitting: Physician Assistant

## 2021-05-05 ENCOUNTER — Encounter: Payer: Self-pay | Admitting: Physician Assistant

## 2021-05-05 ENCOUNTER — Ambulatory Visit: Payer: Self-pay | Admitting: Physician Assistant

## 2021-05-05 VITALS — BP 119/78 | HR 58 | Temp 97.7°F | Wt 151.0 lb

## 2021-05-05 DIAGNOSIS — Z1211 Encounter for screening for malignant neoplasm of colon: Secondary | ICD-10-CM

## 2021-05-05 DIAGNOSIS — E785 Hyperlipidemia, unspecified: Secondary | ICD-10-CM

## 2021-05-05 NOTE — Progress Notes (Signed)
BP 119/78   Pulse (!) 58   Temp 97.7 F (36.5 C)   Wt 151 lb (68.5 kg)   SpO2 98%   BMI 27.18 kg/m    Subjective:    Patient ID: George Morales, male    DOB: 1970-09-28, 50 y.o.   MRN: [id number]  HPI: Brinton Brandel is a 50 y.o. male presenting on 05/05/2021 for Hyperlipidemia   HPI    Pt had a negative covid 19 screening questionnaire.  Chief Complaint  Patient presents with   Hyperlipidemia    Pt has no complaints.  He does not work.     Relevant past medical, surgical, family and social history reviewed and updated as indicated. Interim medical history since our last visit reviewed. Allergies and medications reviewed and updated.   Current Outpatient Medications:    Omega-3 Fatty Acids (FISH OIL) 1000 MG CAPS, Take 3 capsules by mouth at bedtime. , Disp: , Rfl:    simvastatin (ZOCOR) 20 MG tablet, Take 1 tablet (20 mg total) by mouth daily., Disp: 30 tablet, Rfl: 6    Review of Systems  Per HPI unless specifically indicated above     Objective:    BP 119/78   Pulse (!) 58   Temp 97.7 F (36.5 C)   Wt 151 lb (68.5 kg)   SpO2 98%   BMI 27.18 kg/m   Wt Readings from Last 3 Encounters:  05/05/21 151 lb (68.5 kg)  11/02/20 154 lb (69.9 kg)  05/05/20 147 lb (66.7 kg)    Physical Exam Vitals reviewed.  Constitutional:      General: He is not in acute distress.    Appearance: He is well-developed. He is not ill-appearing.  HENT:     Head: Normocephalic and atraumatic.  Cardiovascular:     Rate and Rhythm: Normal rate and regular rhythm.  Pulmonary:     Effort: Pulmonary effort is normal.     Breath sounds: Normal breath sounds. No wheezing.  Abdominal:     General: Bowel sounds are normal.     Palpations: Abdomen is soft.     Tenderness: There is no abdominal tenderness.  Musculoskeletal:     Cervical back: Neck supple.     Right lower leg: No edema.     Left lower leg: No edema.  Lymphadenopathy:     Cervical: No cervical adenopathy.   Skin:    General: Skin is warm and dry.  Neurological:     Mental Status: He is alert and oriented to person, place, and time.  Psychiatric:        Behavior: Behavior normal.    Results for orders placed or performed during the hospital encounter of 05/04/21  Hepatic function panel  Result Value Ref Range   Total Protein 6.6 6.5 - 8.1 g/dL   Albumin 4.2 3.5 - 5.0 g/dL   AST 31 15 - 41 U/L   ALT 48 (H) 0 - 44 U/L   Alkaline Phosphatase 53 38 - 126 U/L   Total Bilirubin 0.5 0.3 - 1.2 mg/dL   Bilirubin, Direct 0.1 0.0 - 0.2 mg/dL   Indirect Bilirubin 0.4 0.3 - 0.9 mg/dL  Lipid panel  Result Value Ref Range   Cholesterol 162 0 - 200 mg/dL   Triglycerides 56 <291 mg/dL   HDL 48 >91 mg/dL   Total CHOL/HDL Ratio 3.4 RATIO   VLDL 11 0 - 40 mg/dL   LDL Cholesterol 660 (H) 0 - 99 mg/dL  Assessment & Plan:    Encounter Diagnoses  Name Primary?   Hyperlipidemia, unspecified hyperlipidemia type Yes   Screening for colon cancer      -reviewed labs with pt -pt to continue current medications -pt was given FIT test for colon cancer screeing -pt to follow up 6 months.  He is to contact office sooner prn

## 2021-05-10 LAB — IFOBT (OCCULT BLOOD): IFOBT: NEGATIVE

## 2021-06-15 ENCOUNTER — Other Ambulatory Visit: Payer: Self-pay | Admitting: Physician Assistant

## 2021-09-19 ENCOUNTER — Other Ambulatory Visit: Payer: Self-pay | Admitting: Physician Assistant

## 2021-09-19 MED ORDER — SIMVASTATIN 20 MG PO TABS: 20.0000 mg | ORAL_TABLET | Freq: Every day | ORAL | 4 refills | Status: DC

## 2021-10-17 ENCOUNTER — Other Ambulatory Visit: Payer: Self-pay | Admitting: Physician Assistant

## 2021-10-17 DIAGNOSIS — E785 Hyperlipidemia, unspecified: Secondary | ICD-10-CM

## 2021-10-27 ENCOUNTER — Other Ambulatory Visit (HOSPITAL_COMMUNITY)
Admission: RE | Admit: 2021-10-27 | Discharge: 2021-10-27 | Disposition: A | Discharge status: Home / Self Care | Payer: Self-pay | Source: Ambulatory Visit | Attending: Physician Assistant | Admitting: Physician Assistant

## 2021-10-27 DIAGNOSIS — E785 Hyperlipidemia, unspecified: Secondary | ICD-10-CM | POA: Insufficient documentation

## 2021-10-27 LAB — LIPID PANEL
Cholesterol: 156 mg/dL (ref 0–200)
HDL: 48 mg/dL (ref >40)
LDL Cholesterol: 92 mg/dL (ref 0–99)
Total CHOL/HDL Ratio: 3.3 RATIO
Triglycerides: 82 mg/dL (ref <150)
VLDL: 16 mg/dL (ref 0–40)

## 2021-10-27 LAB — COMPREHENSIVE METABOLIC PANEL
ALT: 47 U/L — ABNORMAL HIGH (ref 0–44)
AST: 28 U/L (ref 15–41)
Albumin: 4.3 g/dL (ref 3.5–5.0)
Alkaline Phosphatase: 55 U/L (ref 38–126)
Anion gap: 8 (ref 5–15)
BUN: 15 mg/dL (ref 6–20)
CO2: 25 mmol/L (ref 22–32)
Calcium: 9.2 mg/dL (ref 8.9–10.3)
Chloride: 105 mmol/L (ref 98–111)
Creatinine, Ser: 0.77 mg/dL (ref 0.61–1.24)
GFR, Estimated: >60 mL/min (ref >60)
Glucose, Bld: 109 mg/dL — ABNORMAL HIGH (ref 70–99)
Potassium: 4.2 mmol/L (ref 3.5–5.1)
Sodium: 138 mmol/L (ref 135–145)
Total Bilirubin: 0.6 mg/dL (ref 0.3–1.2)
Total Protein: 7.1 g/dL (ref 6.5–8.1)

## 2021-11-02 ENCOUNTER — Ambulatory Visit: Payer: Self-pay | Admitting: Physician Assistant

## 2021-11-02 ENCOUNTER — Other Ambulatory Visit: Payer: Self-pay

## 2021-11-02 ENCOUNTER — Encounter: Payer: Self-pay | Admitting: Physician Assistant

## 2021-11-02 VITALS — BP 120/70 | HR 59 | Temp 98.8°F | Wt 151.0 lb

## 2021-11-02 DIAGNOSIS — M545 Low back pain, unspecified: Secondary | ICD-10-CM

## 2021-11-02 DIAGNOSIS — E785 Hyperlipidemia, unspecified: Secondary | ICD-10-CM

## 2021-11-02 MED ORDER — SIMVASTATIN 20 MG PO TABS: 20.0000 mg | ORAL_TABLET | Freq: Every day | ORAL | 4 refills | Status: AC

## 2021-11-02 MED ORDER — PREDNISONE 10 MG (21) PO TBPK: ORAL_TABLET | ORAL | 0 refills | Status: DC

## 2021-11-02 NOTE — Patient Instructions (Signed)
Back Exercises °These exercises help to make your trunk and back strong. They also help to keep the lower back flexible. Doing these exercises can help to prevent or lessen pain in your lower back. °If you have back pain, try to do these exercises 2-3 times each day or as told by your doctor. °As you get better, do the exercises once each day. Repeat the exercises more often as told by your doctor. °To stop back pain from coming back, do the exercises once each day, or as told by your doctor. °Do exercises exactly as told by your doctor. Stop right away if you feel sudden pain or your pain gets worse. °Exercises °Single knee to chest °Do these steps 3-5 times in a row for each leg: °Lie on your back on a firm bed or the floor with your legs stretched out. °Bring one knee to your chest. °Grab your knee or thigh with both hands and hold it in place. °Pull on your knee until you feel a gentle stretch in your lower back or butt. °Keep doing the stretch for 10-30 seconds. °Slowly let go of your leg and straighten it. °Pelvic tilt °Do these steps 5-10 times in a row: °Lie on your back on a firm bed or the floor with your legs stretched out. °Bend your knees so they point up to the ceiling. Your feet should be flat on the floor. °Tighten your lower belly (abdomen) muscles to press your lower back against the floor. This will make your tailbone point up to the ceiling instead of pointing down to your feet or the floor. °Stay in this position for 5-10 seconds while you gently tighten your muscles and breathe evenly. °Cat-cow °Do these steps until your lower back bends more easily: °Get on your hands and knees on a firm bed or the floor. Keep your hands under your shoulders, and keep your knees under your hips. You may put padding under your knees. °Let your head hang down toward your chest. Tighten (contract) the muscles in your belly. Point your tailbone toward the floor so your lower back becomes rounded like the back of a  cat. °Stay in this position for 5 seconds. °Slowly lift your head. Let the muscles of your belly relax. Point your tailbone up toward the ceiling so your back forms a sagging arch like the back of a cow. °Stay in this position for 5 seconds. ° °Press-ups °Do these steps 5-10 times in a row: °Lie on your belly (face-down) on a firm bed or the floor. °Place your hands near your head, about shoulder-width apart. °While you keep your back relaxed and keep your hips on the floor, slowly straighten your arms to raise the top half of your body and lift your shoulders. Do not use your back muscles. You may change where you place your hands to make yourself more comfortable. °Stay in this position for 5 seconds. Keep your back relaxed. °Slowly return to lying flat on the floor. ° °Bridges °Do these steps 10 times in a row: °Lie on your back on a firm bed or the floor. °Bend your knees so they point up to the ceiling. Your feet should be flat on the floor. Your arms should be flat at your sides, next to your body. °Tighten your butt muscles and lift your butt off the floor until your waist is almost as high as your knees. If you do not feel the muscles working in your butt and the back of   your thighs, slide your feet 1-2 inches (2.5-5 cm) farther away from your butt. °Stay in this position for 3-5 seconds. °Slowly lower your butt to the floor, and let your butt muscles relax. °If this exercise is too easy, try doing it with your arms crossed over your chest. °Belly crunches °Do these steps 5-10 times in a row: °Lie on your back on a firm bed or the floor with your legs stretched out. °Bend your knees so they point up to the ceiling. Your feet should be flat on the floor. °Cross your arms over your chest. °Tip your chin a little bit toward your chest, but do not bend your neck. °Tighten your belly muscles and slowly raise your chest just enough to lift your shoulder blades a tiny bit off the floor. Avoid raising your body  higher than that because it can put too much stress on your lower back. °Slowly lower your chest and your head to the floor. °Back lifts °Do these steps 5-10 times in a row: °Lie on your belly (face-down) with your arms at your sides, and rest your forehead on the floor. °Tighten the muscles in your legs and your butt. °Slowly lift your chest off the floor while you keep your hips on the floor. Keep the back of your head in line with the curve in your back. Look at the floor while you do this. °Stay in this position for 3-5 seconds. °Slowly lower your chest and your face to the floor. °Contact a doctor if: °Your back pain gets a lot worse when you do an exercise. °Your back pain does not get better within 2 hours after you exercise. °If you have any of these problems, stop doing the exercises. Do not do them again unless your doctor says it is okay. °Get help right away if: °You have sudden, very bad back pain. If this happens, stop doing the exercises. Do not do them again unless your doctor says it is okay. °This information is not intended to replace advice given to you by your health care provider. Make sure you discuss any questions you have with your health care provider. °Document Revised: 10/27/2020 Document Reviewed: 10/27/2020 °Elsevier Patient Education © 2022 [employer] ° °

## 2021-11-02 NOTE — Progress Notes (Signed)
? ?BP 120/70   Pulse (!) 59   Temp 98.8 ?F (37.1 ?C)   Wt 151 lb (68.5 kg)   SpO2 99%   BMI 27.18 kg/m?   ? ?Subjective:  ? ? Patient ID: George Morales, male    DOB: 26-Jul-1971, 51 y.o.   MRN: [id number] ? ?HPI: ?George Morales is a 51 y.o. male presenting on 11/02/2021 for Hyperlipidemia ? ? ?HPI ? ? ?Chief Complaint  ?Patient presents with  ? Hyperlipidemia  ? ? ? ?He has back pain for about 3 months.  He says he does not work.  He says he has not worked for 25 years or so.  He says he spends his days just sitting around doing nothing. He is unaware of injury.  He has tried OTC analgesics but still has the pain.  It sometimes radiates into his RLE.  No numbness or tingling.  No bladder or bowel complaints.  ? ? ?Relevant past medical, surgical, family and social history reviewed and updated as indicated. Interim medical history since our last visit reviewed. ?Allergies and medications reviewed and updated. ? ? ? ?Current Outpatient Medications:  ?  Omega-3 Fatty Acids (FISH OIL) 1000 MG CAPS, Take 3 capsules by mouth at bedtime. , Disp: , Rfl:  ?  simvastatin (ZOCOR) 20 MG tablet, Take 1 tablet (20 mg total) by mouth daily., Disp: 30 tablet, Rfl: 4 ? ? ?Review of Systems ? ?Per HPI unless specifically indicated above ? ?   ?Objective:  ?  ?BP 120/70   Pulse (!) 59   Temp 98.8 ?F (37.1 ?C)   Wt 151 lb (68.5 kg)   SpO2 99%   BMI 27.18 kg/m?   ?Wt Readings from Last 3 Encounters:  ?11/02/21 151 lb (68.5 kg)  ?05/05/21 151 lb (68.5 kg)  ?11/02/20 154 lb (69.9 kg)  ?  ?Physical Exam ?Vitals reviewed.  ?Constitutional:   ?   General: He is not in acute distress. ?   Appearance: He is well-developed. He is not ill-appearing.  ?HENT:  ?   Head: Normocephalic and atraumatic.  ?Cardiovascular:  ?   Rate and Rhythm: Normal rate and regular rhythm.  ?Pulmonary:  ?   Effort: Pulmonary effort is normal.  ?   Breath sounds: Normal breath sounds. No wheezing.  ?Abdominal:  ?   General: Bowel sounds are normal.  ?    Palpations: Abdomen is soft.  ?   Tenderness: There is no abdominal tenderness.  ?Musculoskeletal:  ?   Cervical back: Neck supple.  ?   Thoracic back: No swelling, deformity, tenderness or bony tenderness. Normal range of motion.  ?   Lumbar back: No swelling, deformity, tenderness or bony tenderness. Normal range of motion. Negative right straight leg raise test and negative left straight leg raise test.  ?   Right lower leg: No edema.  ?   Left lower leg: No edema.  ?Lymphadenopathy:  ?   Cervical: No cervical adenopathy.  ?Skin: ?   General: Skin is warm and dry.  ?Neurological:  ?   Mental Status: He is alert and oriented to person, place, and time.  ?Psychiatric:     ?   Behavior: Behavior normal.  ? ? ?Results for orders placed or performed during the hospital encounter of 10/27/21  ?Lipid panel  ?Result Value Ref Range  ? Cholesterol 156 0 - 200 mg/dL  ? Triglycerides 82 <150 mg/dL  ? HDL 48 >40 mg/dL  ? Total CHOL/HDL Ratio 3.3 RATIO  ?  VLDL 16 0 - 40 mg/dL  ? LDL Cholesterol 92 0 - 99 mg/dL  ?Comprehensive metabolic panel  ?Result Value Ref Range  ? Sodium 138 135 - 145 mmol/L  ? Potassium 4.2 3.5 - 5.1 mmol/L  ? Chloride 105 98 - 111 mmol/L  ? CO2 25 22 - 32 mmol/L  ? Glucose, Bld 109 (H) 70 - 99 mg/dL  ? BUN 15 6 - 20 mg/dL  ? Creatinine, Ser 0.77 0.61 - 1.24 mg/dL  ? Calcium 9.2 8.9 - 10.3 mg/dL  ? Total Protein 7.1 6.5 - 8.1 g/dL  ? Albumin 4.3 3.5 - 5.0 g/dL  ? AST 28 15 - 41 U/L  ? ALT 47 (H) 0 - 44 U/L  ? Alkaline Phosphatase 55 38 - 126 U/L  ? Total Bilirubin 0.6 0.3 - 1.2 mg/dL  ? GFR, Estimated >60 >60 mL/min  ? Anion gap 8 5 - 15  ? ?   ?Assessment & Plan:  ? ? ?Encounter Diagnoses  ?Name Primary?  ? Hyperlipidemia, unspecified hyperlipidemia type Yes  ? Low back pain, unspecified back pain laterality, unspecified chronicity, unspecified whether sciatica present   ? ? ? ?-Pt is told that he will not be seen again until he completes enrollment.  There is no enrollment on file. ?-reviewed labs  with pt.   Pt to continue his simvastatin ?-He is scheduled for follow up dyslipidemia in 6 months ?-prednisone pack for the back.  Pt was counseled on need for exercise to keep the back healthy.  He was given handout.  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

## 2022-01-12 ENCOUNTER — Ambulatory Visit (INDEPENDENT_AMBULATORY_CARE_PROVIDER_SITE_OTHER): Payer: Self-pay

## 2022-01-12 ENCOUNTER — Other Ambulatory Visit: Payer: Self-pay

## 2022-01-12 ENCOUNTER — Ambulatory Visit
Admission: EM | Admit: 2022-01-12 | Discharge: 2022-01-12 | Disposition: A | Discharge status: Home / Self Care | Payer: Self-pay | Attending: Nurse Practitioner | Admitting: Nurse Practitioner

## 2022-01-12 ENCOUNTER — Encounter: Payer: Self-pay | Admitting: Emergency Medicine

## 2022-01-12 DIAGNOSIS — M5442 Lumbago with sciatica, left side: Secondary | ICD-10-CM

## 2022-01-12 DIAGNOSIS — M549 Dorsalgia, unspecified: Secondary | ICD-10-CM

## 2022-01-12 MED ORDER — KETOROLAC TROMETHAMINE 30 MG/ML IJ SOLN
30.0000 mg | Freq: Once | INTRAMUSCULAR | Status: AC
  Administered 2022-01-12: 30 mg via INTRAMUSCULAR

## 2022-01-12 MED ORDER — IBUPROFEN 800 MG PO TABS: 800.0000 mg | ORAL_TABLET | Freq: Three times a day (TID) | ORAL | 0 refills | Status: AC | PRN

## 2022-01-12 MED ORDER — PREDNISONE 10 MG (21) PO TBPK: ORAL_TABLET | ORAL | 0 refills | Status: AC

## 2022-01-12 NOTE — ED Triage Notes (Signed)
Pt reports lower back pain radiating to left leg since walking on a roof in January. Pt reports pain has been constant and denies any other gi/gu symptoms.

## 2022-01-12 NOTE — ED Provider Notes (Signed)
[facility]    CSN: [id number] Arrival date & time: 01/12/22  1219      History   Chief Complaint Chief Complaint  Patient presents with   Back Pain    HPI George Morales is a 51 y.o. male.   The patient is a 51 year old male who presents with left-sided low back pain.  Symptoms have been present for the past 3 to 4 months per the patient's report.  Pain starts in his lower back and moves down his left lower leg.  Patient states his left leg is now becoming numb.  He denies any previous injury, trauma or urinary symptoms. Pain worsens with bending, twisting, turning, and when he is sleeping.  Patient has been taking ibuprofen, Tylenol, and Motrin for his symptoms without relief.   He denies any previous history of cancer or immunosuppression..    The history is provided by the patient and a friend.   Past Medical History:  Diagnosis Date   Hypercholesteremia     Patient Active Problem List   Diagnosis Date Noted   Hyperlipidemia 11/01/2015    History reviewed. No pertinent surgical history.     Home Medications    Prior to Admission medications   Medication Sig Start Date End Date Taking? Authorizing Provider  Omega-3 Fatty Acids (FISH OIL) 1000 MG CAPS Take 3 capsules by mouth at bedtime.     [provider]  predniSONE (STERAPRED UNI-PAK 21 TAB) 10 MG (21) TBPK tablet Day 1 take 6 tablets po qam. Day 2 take 5 tablets po qam. Day 3 take 4 tablets po qam. Day 4 take 3 tablets po qam. Day 5 take 2 tablets po qam. Day 6 take 1 tablet po qam. 11/02/21   Jacquelin Hawking, PA-C  simvastatin (ZOCOR) 20 MG tablet Take 1 tablet (20 mg total) by mouth daily. 11/02/21   Jacquelin Hawking, PA-C    Family History History reviewed. No pertinent family history.  Social History Social History   Tobacco Use   Smoking status: Never   Smokeless tobacco: Never  Substance Use Topics   Alcohol use: Yes   Drug use: No     Allergies   Morphine and  related   Review of Systems Review of Systems  Constitutional: Negative.   Respiratory: Negative.    Cardiovascular: Negative.   Gastrointestinal: Negative.   Musculoskeletal:  Positive for back pain.  Psychiatric/Behavioral: Negative.      Physical Exam Triage Vital Signs ED Triage Vitals  Enc Vitals Group     BP 01/12/22 1506 119/77     Pulse Rate 01/12/22 1506 (!) 56     Resp 01/12/22 1506 18     Temp 01/12/22 1506 98.2 F (36.8 C)     Temp Source 01/12/22 1506 Oral     SpO2 01/12/22 1506 96 %     Weight 01/12/22 1508 150 lb (68 kg)     Height 01/12/22 1508 5\' 4"  (1.626 m)     Head Circumference --      Peak Flow --      Pain Score 01/12/22 1507 10     Pain Loc --      Pain Edu? --      Excl. in GC? --    No data found.  Updated Vital Signs BP 119/77 (BP Location: Right Arm)   Pulse (!) 56   Temp 98.2 F (36.8 C) (Oral)   Resp 18   Ht 5\' 4"  (1.626 m)   Wt  150 lb (68 kg)   SpO2 96%   BMI 25.75 kg/m   Visual Acuity Right Eye Distance:   Left Eye Distance:   Bilateral Distance:    Right Eye Near:   Left Eye Near:    Bilateral Near:     Physical Exam Vitals and nursing note reviewed.  Constitutional:      General: He is not in acute distress.    Appearance: Normal appearance. He is well-developed.  HENT:     Head: Normocephalic and atraumatic.  Eyes:     Extraocular Movements: Extraocular movements intact.     Conjunctiva/sclera: Conjunctivae normal.     Pupils: Pupils are equal, round, and reactive to light.  Cardiovascular:     Rate and Rhythm: Normal rate and regular rhythm.     Pulses: Normal pulses.     Heart sounds: Normal heart sounds. No murmur heard. Pulmonary:     Effort: Pulmonary effort is normal. No respiratory distress.     Breath sounds: Normal breath sounds.  Abdominal:     General: Bowel sounds are normal.     Palpations: Abdomen is soft.     Tenderness: There is no abdominal tenderness.  Musculoskeletal:        General:  No swelling.     Cervical back: Normal range of motion and neck supple.     Lumbar back: Spasms and tenderness (left lower back L2-L5) present. No swelling, signs of trauma or bony tenderness. Decreased range of motion. Negative right straight leg raise test and negative left straight leg raise test.  Skin:    General: Skin is warm and dry.     Capillary Refill: Capillary refill takes less than 2 seconds.  Neurological:     Mental Status: He is alert.  Psychiatric:        Mood and Affect: Mood normal.     UC Treatments / Results  Labs (all labs ordered are listed, but only abnormal results are displayed) Labs Reviewed - No data to display  EKG   Radiology DG Lumbar Spine Complete  Result Date: 01/12/2022 CLINICAL DATA:  Back pain for 4 months EXAM: LUMBAR SPINE - COMPLETE 4+ VIEW COMPARISON:  06/19/2021 FINDINGS: Five lumbar type vertebral segments. Vertebral body heights are maintained without evidence of fracture. 5 mm grade 1 anterolisthesis of L5 on S1 is new from prior. Intervertebral disc heights are preserved. Degenerative endplate spurring. Mild lower lumbar facet arthrosis. Scattered abdominal aortic atherosclerosis. IMPRESSION: New 5 mm grade 1 anterolisthesis of L5 on S1, likely degenerative. Electronically Signed   By: Duanne GuessNicholas  Plundo D.O.   On: 01/12/2022 15:44    Procedures Procedures (including critical care time)  Medications Ordered in UC Medications  ketorolac (TORADOL) 30 MG/ML injection 30 mg (has no administration in time range)    Initial Impression / Assessment and Plan / UC Course  I have reviewed the triage vital signs and the nursing notes.  Pertinent labs & imaging results that were available during my care of the patient were reviewed by me and considered in my medical decision making (see chart for details).  The patient is a 51 year old male who presents with left-sided low back pain.  Symptoms have been persistent for the past 3 to 4 months.   Patient states pain radiates down into the left lower extremity.  He states that he is now has numbness in the left lower extremity.  Symptoms are consistent with sciatica.  His x-ray does show anterolisthesis of L5 and S1,  with suggest some degeneration.  We will start the patient on a prednisone taper to see if this helps with his symptoms.  Toradol injection was given in the clinic today.  Patient was advised to continue to stay as active as possible.  Sciatica stretching exercises were also provided.  Recommended the use of ice to the low back as needed.  Instructions were provided.  Strict return precautions were given to the patient to include loss of bowel or bladder function, inability to ambulate, or weakness in the lower extremities.  Patient verbalizes understanding.  All questions were answered. Final Clinical Impressions(s) / UC Diagnoses   Final diagnoses:  Left-sided low back pain with left-sided sciatica, unspecified chronicity   Discharge Instructions   None    ED Prescriptions   None    PDMP not reviewed this encounter.   Abran Cantor, NP 01/12/22 1558

## 2022-01-12 NOTE — Discharge Instructions (Addendum)
Your x-ray does not show any fracture or dislocation, but does show some degeneration. A prednisone taper has been ordered to help with your symptoms.  Start the medication on 01/13/2022. You may take Tylenol while taking the prednisone.  When she finished the prednisone, you can start taking ibuprofen, which I have also prescribed today. Apply ice to the affected area to help with pain or swelling.  Apply for 20 minutes, remove for 1 hour, then repeat.  May apply heat for pain or stiffness in the same manner. Follow-up in the ER immediately if you develop loss of bowel or bladder function,  inability to walk, or weakness in your lower extremities. Follow-up as needed.

## 2022-05-09 ENCOUNTER — Ambulatory Visit: Payer: Self-pay | Admitting: Physician Assistant

## 2023-08-24 IMAGING — DX DG LUMBAR SPINE COMPLETE 4+V
4 series · 4 of 4 positions shown · non-contrast
Comparison: 06/19/2021

CLINICAL DATA: Back pain for 4 months

EXAM:
LUMBAR SPINE - COMPLETE 4+ VIEW

[lumbar spine ap]
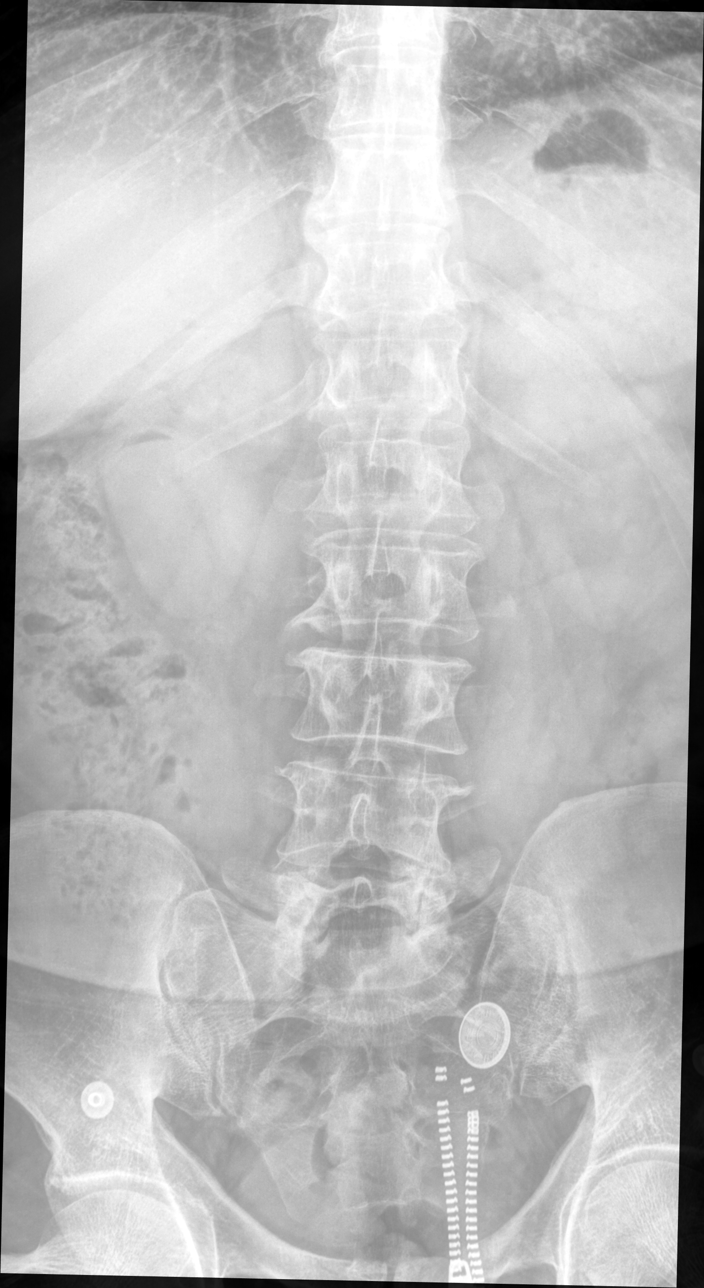

[lumbar spine mlo (1 of 2)]
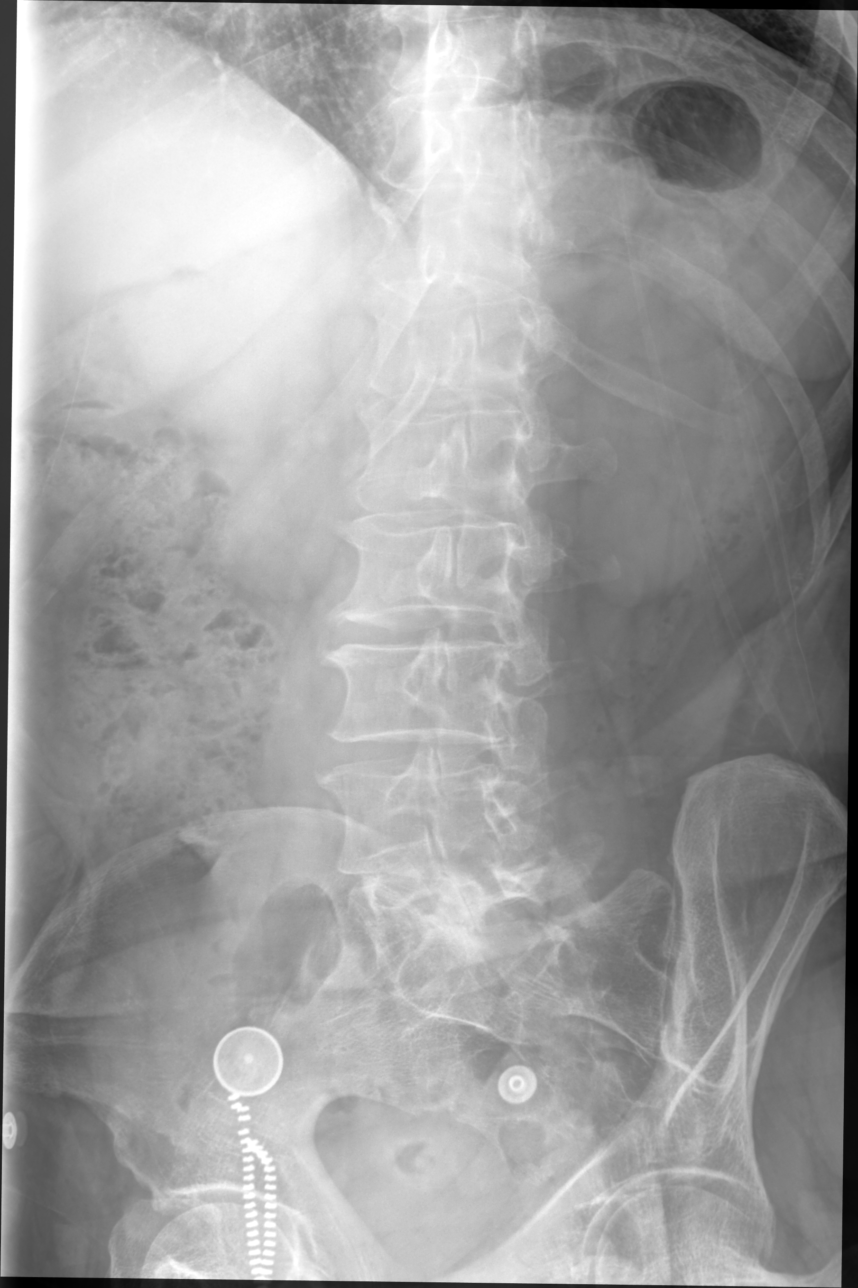

[lumbar spine mlo (2 of 2)]
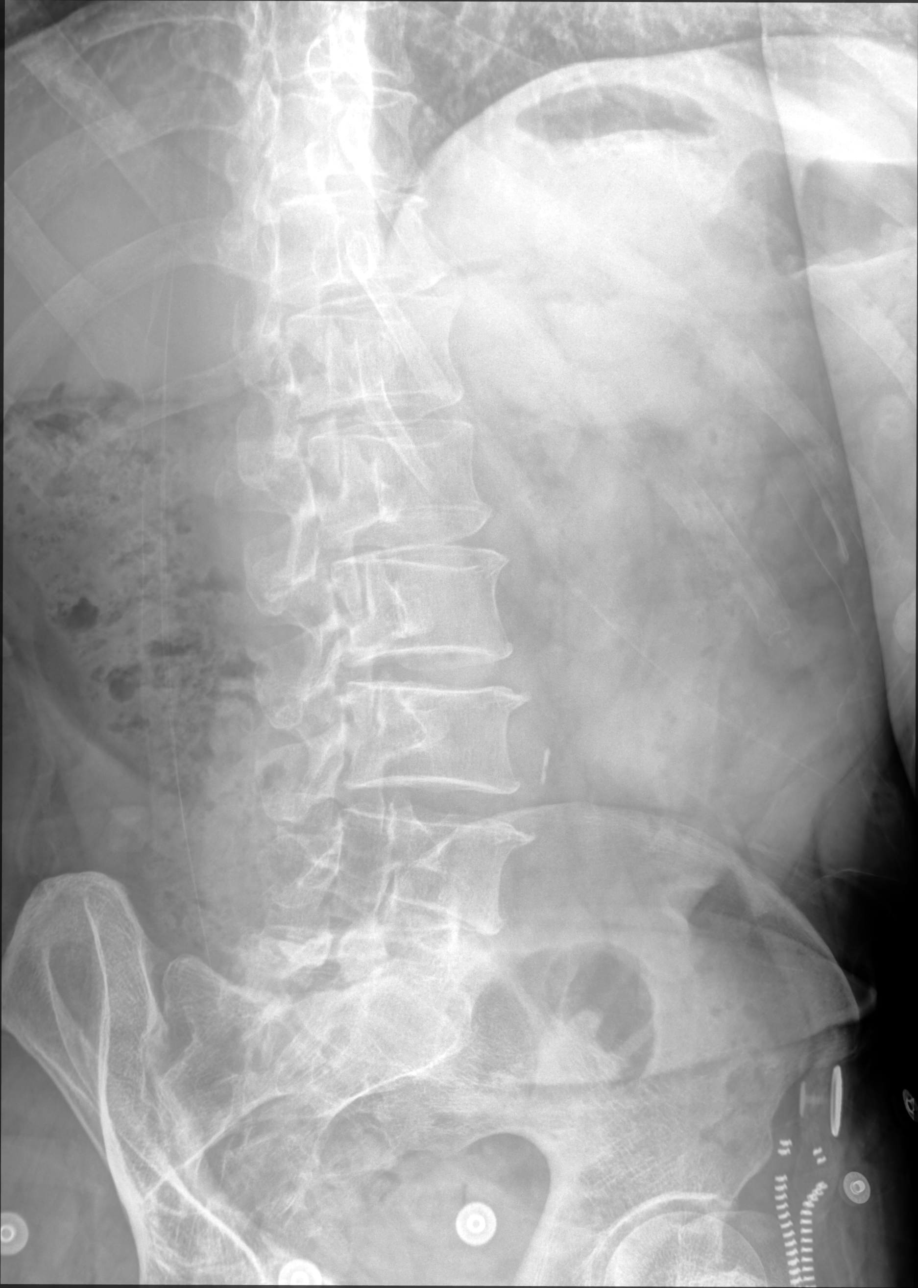

[lumbar spine lat]
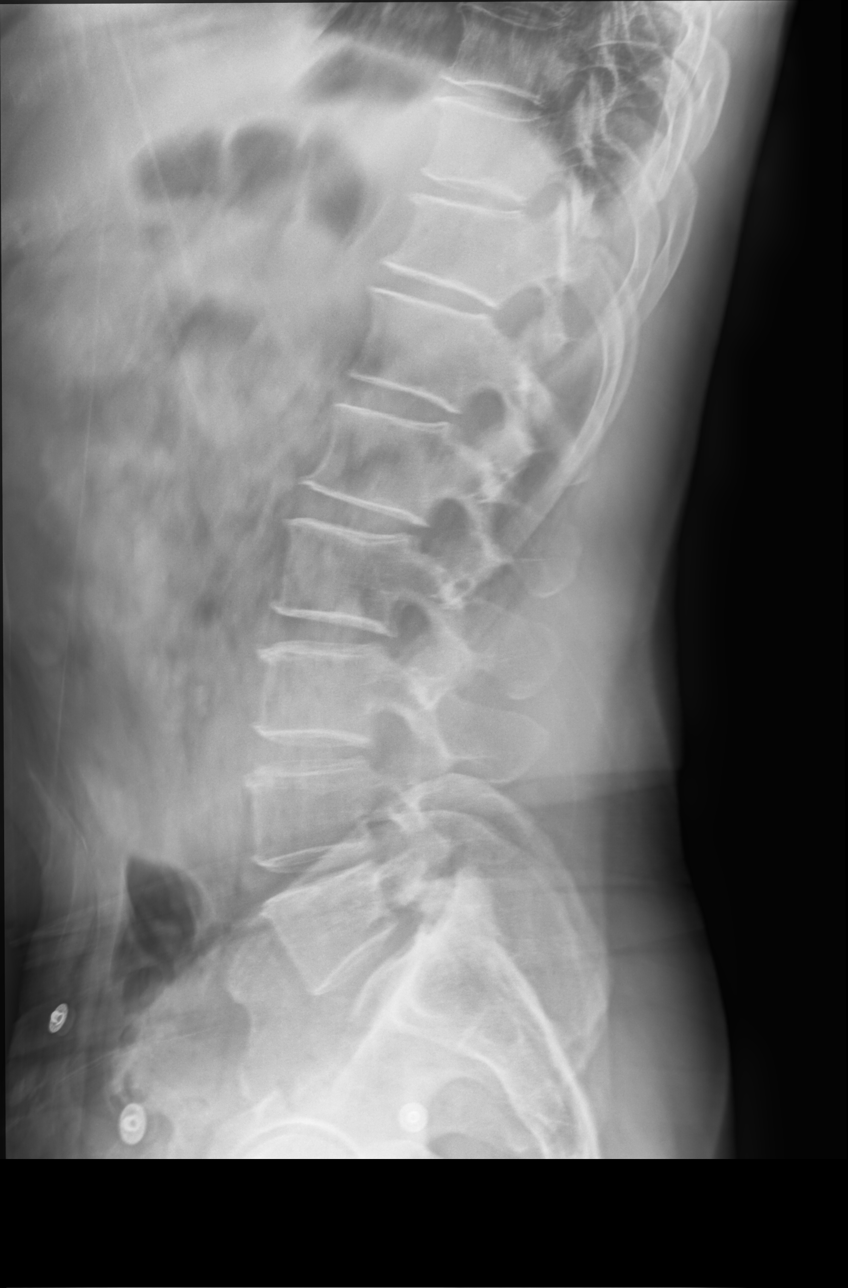

[4 of 4 positions shown; findings below may reference images not displayed]

FINDINGS: Five lumbar type vertebral segments. Vertebral body heights are
maintained without evidence of fracture. 5 mm grade 1
anterolisthesis of L5 on S1 is new from prior. Intervertebral disc
heights are preserved. Degenerative endplate spurring. Mild lower
lumbar facet arthrosis. Scattered abdominal aortic atherosclerosis.
IMPRESSION: New 5 mm grade 1 anterolisthesis of L5 on S1, likely degenerative.
